# Patient Record
Sex: Female | Born: 1945
Health system: Southern US, Community
[De-identification: ages and names within clinical notes are randomized; demographics above are authoritative.]

## PROBLEM LIST (undated history)

## (undated) DIAGNOSIS — I10 Essential (primary) hypertension: Secondary | ICD-10-CM

## (undated) DIAGNOSIS — I499 Cardiac arrhythmia, unspecified: Secondary | ICD-10-CM

## (undated) DIAGNOSIS — M1990 Unspecified osteoarthritis, unspecified site: Secondary | ICD-10-CM

## (undated) DIAGNOSIS — Z974 Presence of external hearing-aid: Secondary | ICD-10-CM

## (undated) DIAGNOSIS — N950 Postmenopausal bleeding: Secondary | ICD-10-CM

## (undated) DIAGNOSIS — F419 Anxiety disorder, unspecified: Secondary | ICD-10-CM

## (undated) DIAGNOSIS — R7303 Prediabetes: Secondary | ICD-10-CM

## (undated) HISTORY — PX: EYE SURGERY: SHX253

## (undated) HISTORY — PX: OTHER SURGICAL HISTORY: SHX169

---

## 1997-06-08 ENCOUNTER — Other Ambulatory Visit: Admission: RE | Admit: 1997-06-08 | Discharge: 1997-06-08 | Payer: Self-pay | Admitting: Obstetrics & Gynecology

## 1998-05-21 ENCOUNTER — Other Ambulatory Visit: Admission: RE | Admit: 1998-05-21 | Discharge: 1998-05-21 | Payer: Self-pay | Admitting: Obstetrics & Gynecology

## 1999-08-19 ENCOUNTER — Other Ambulatory Visit: Admission: RE | Admit: 1999-08-19 | Discharge: 1999-08-19 | Payer: Self-pay | Admitting: Obstetrics & Gynecology

## 2000-06-09 ENCOUNTER — Emergency Department (HOSPITAL_COMMUNITY): Admission: EM | Admit: 2000-06-09 | Discharge: 2000-06-09 | Payer: Self-pay | Admitting: Emergency Medicine

## 2001-06-07 ENCOUNTER — Emergency Department (HOSPITAL_COMMUNITY): Admission: EM | Admit: 2001-06-07 | Discharge: 2001-06-07 | Payer: Self-pay | Admitting: Emergency Medicine

## 2001-10-03 ENCOUNTER — Other Ambulatory Visit: Admission: RE | Admit: 2001-10-03 | Discharge: 2001-10-03 | Payer: Self-pay | Admitting: Obstetrics & Gynecology

## 2002-11-21 ENCOUNTER — Other Ambulatory Visit: Admission: RE | Admit: 2002-11-21 | Discharge: 2002-11-21 | Payer: Self-pay | Admitting: Obstetrics & Gynecology

## 2009-01-04 ENCOUNTER — Ambulatory Visit (HOSPITAL_COMMUNITY): Admission: RE | Admit: 2009-01-04 | Discharge: 2009-01-04 | Payer: Self-pay | Admitting: Obstetrics & Gynecology

## 2010-02-04 ENCOUNTER — Encounter
Admission: RE | Admit: 2010-02-04 | Discharge: 2010-02-04 | Payer: Self-pay | Source: Home / Self Care | Attending: Obstetrics & Gynecology | Admitting: Obstetrics & Gynecology

## 2011-01-13 DIAGNOSIS — H6120 Impacted cerumen, unspecified ear: Secondary | ICD-10-CM | POA: Diagnosis not present

## 2011-02-15 DIAGNOSIS — E782 Mixed hyperlipidemia: Secondary | ICD-10-CM | POA: Diagnosis not present

## 2011-02-15 DIAGNOSIS — I10 Essential (primary) hypertension: Secondary | ICD-10-CM | POA: Diagnosis not present

## 2011-02-22 DIAGNOSIS — R7301 Impaired fasting glucose: Secondary | ICD-10-CM | POA: Diagnosis not present

## 2011-02-22 DIAGNOSIS — E782 Mixed hyperlipidemia: Secondary | ICD-10-CM | POA: Diagnosis not present

## 2011-02-22 DIAGNOSIS — I10 Essential (primary) hypertension: Secondary | ICD-10-CM | POA: Diagnosis not present

## 2011-05-03 DIAGNOSIS — L84 Corns and callosities: Secondary | ICD-10-CM | POA: Diagnosis not present

## 2011-05-03 DIAGNOSIS — M2040 Other hammer toe(s) (acquired), unspecified foot: Secondary | ICD-10-CM | POA: Diagnosis not present

## 2011-05-03 DIAGNOSIS — Q828 Other specified congenital malformations of skin: Secondary | ICD-10-CM | POA: Diagnosis not present

## 2011-05-11 DIAGNOSIS — M79609 Pain in unspecified limb: Secondary | ICD-10-CM | POA: Diagnosis not present

## 2011-06-22 DIAGNOSIS — E782 Mixed hyperlipidemia: Secondary | ICD-10-CM | POA: Diagnosis not present

## 2011-06-22 DIAGNOSIS — I10 Essential (primary) hypertension: Secondary | ICD-10-CM | POA: Diagnosis not present

## 2011-06-22 DIAGNOSIS — R7301 Impaired fasting glucose: Secondary | ICD-10-CM | POA: Diagnosis not present

## 2011-06-29 ENCOUNTER — Other Ambulatory Visit: Payer: Self-pay | Admitting: Internal Medicine

## 2011-06-29 DIAGNOSIS — Z1231 Encounter for screening mammogram for malignant neoplasm of breast: Secondary | ICD-10-CM

## 2011-06-29 DIAGNOSIS — I10 Essential (primary) hypertension: Secondary | ICD-10-CM | POA: Diagnosis not present

## 2011-06-29 DIAGNOSIS — Z0000 Encounter for general adult medical examination without abnormal findings: Secondary | ICD-10-CM | POA: Diagnosis not present

## 2011-06-29 DIAGNOSIS — E782 Mixed hyperlipidemia: Secondary | ICD-10-CM | POA: Diagnosis not present

## 2011-06-29 DIAGNOSIS — Z1331 Encounter for screening for depression: Secondary | ICD-10-CM | POA: Diagnosis not present

## 2011-07-05 ENCOUNTER — Ambulatory Visit
Admission: RE | Admit: 2011-07-05 | Discharge: 2011-07-05 | Disposition: A | Discharge status: Home / Self Care | Payer: Medicare Other | Source: Ambulatory Visit | Attending: Internal Medicine | Admitting: Internal Medicine

## 2011-07-05 DIAGNOSIS — Z1231 Encounter for screening mammogram for malignant neoplasm of breast: Secondary | ICD-10-CM | POA: Diagnosis not present

## 2011-11-01 DIAGNOSIS — J029 Acute pharyngitis, unspecified: Secondary | ICD-10-CM | POA: Diagnosis not present

## 2011-11-01 DIAGNOSIS — J069 Acute upper respiratory infection, unspecified: Secondary | ICD-10-CM | POA: Diagnosis not present

## 2011-11-09 DIAGNOSIS — E782 Mixed hyperlipidemia: Secondary | ICD-10-CM | POA: Diagnosis not present

## 2011-11-09 DIAGNOSIS — I10 Essential (primary) hypertension: Secondary | ICD-10-CM | POA: Diagnosis not present

## 2011-11-13 DIAGNOSIS — J209 Acute bronchitis, unspecified: Secondary | ICD-10-CM | POA: Diagnosis not present

## 2011-11-13 DIAGNOSIS — E782 Mixed hyperlipidemia: Secondary | ICD-10-CM | POA: Diagnosis not present

## 2011-11-13 DIAGNOSIS — R7301 Impaired fasting glucose: Secondary | ICD-10-CM | POA: Diagnosis not present

## 2011-11-13 DIAGNOSIS — I10 Essential (primary) hypertension: Secondary | ICD-10-CM | POA: Diagnosis not present

## 2011-12-29 DIAGNOSIS — Z23 Encounter for immunization: Secondary | ICD-10-CM | POA: Diagnosis not present

## 2012-01-08 DIAGNOSIS — H25049 Posterior subcapsular polar age-related cataract, unspecified eye: Secondary | ICD-10-CM | POA: Diagnosis not present

## 2012-01-08 DIAGNOSIS — H2510 Age-related nuclear cataract, unspecified eye: Secondary | ICD-10-CM | POA: Diagnosis not present

## 2012-01-11 DIAGNOSIS — H35349 Macular cyst, hole, or pseudohole, unspecified eye: Secondary | ICD-10-CM | POA: Diagnosis not present

## 2012-01-16 DIAGNOSIS — H35349 Macular cyst, hole, or pseudohole, unspecified eye: Secondary | ICD-10-CM | POA: Diagnosis not present

## 2012-01-23 ENCOUNTER — Ambulatory Visit: Payer: Self-pay | Admitting: Ophthalmology

## 2012-01-23 DIAGNOSIS — I10 Essential (primary) hypertension: Secondary | ICD-10-CM | POA: Diagnosis not present

## 2012-01-23 DIAGNOSIS — H35349 Macular cyst, hole, or pseudohole, unspecified eye: Secondary | ICD-10-CM | POA: Diagnosis not present

## 2012-01-23 DIAGNOSIS — Z01810 Encounter for preprocedural cardiovascular examination: Secondary | ICD-10-CM | POA: Diagnosis not present

## 2012-01-31 ENCOUNTER — Ambulatory Visit: Payer: Self-pay | Admitting: Ophthalmology

## 2012-01-31 DIAGNOSIS — M129 Arthropathy, unspecified: Secondary | ICD-10-CM | POA: Diagnosis not present

## 2012-01-31 DIAGNOSIS — Z79899 Other long term (current) drug therapy: Secondary | ICD-10-CM | POA: Diagnosis not present

## 2012-01-31 DIAGNOSIS — I10 Essential (primary) hypertension: Secondary | ICD-10-CM | POA: Diagnosis not present

## 2012-01-31 DIAGNOSIS — H9190 Unspecified hearing loss, unspecified ear: Secondary | ICD-10-CM | POA: Diagnosis not present

## 2012-01-31 DIAGNOSIS — H35349 Macular cyst, hole, or pseudohole, unspecified eye: Secondary | ICD-10-CM | POA: Diagnosis not present

## 2012-02-29 DIAGNOSIS — H35349 Macular cyst, hole, or pseudohole, unspecified eye: Secondary | ICD-10-CM | POA: Diagnosis not present

## 2012-03-26 DIAGNOSIS — H35349 Macular cyst, hole, or pseudohole, unspecified eye: Secondary | ICD-10-CM | POA: Diagnosis not present

## 2012-04-10 DIAGNOSIS — H52209 Unspecified astigmatism, unspecified eye: Secondary | ICD-10-CM | POA: Diagnosis not present

## 2012-04-10 DIAGNOSIS — H2510 Age-related nuclear cataract, unspecified eye: Secondary | ICD-10-CM | POA: Diagnosis not present

## 2012-04-10 DIAGNOSIS — H40059 Ocular hypertension, unspecified eye: Secondary | ICD-10-CM | POA: Diagnosis not present

## 2012-04-10 DIAGNOSIS — H25049 Posterior subcapsular polar age-related cataract, unspecified eye: Secondary | ICD-10-CM | POA: Diagnosis not present

## 2012-04-15 DIAGNOSIS — I10 Essential (primary) hypertension: Secondary | ICD-10-CM | POA: Diagnosis not present

## 2012-04-22 DIAGNOSIS — I10 Essential (primary) hypertension: Secondary | ICD-10-CM | POA: Diagnosis not present

## 2012-04-22 DIAGNOSIS — E782 Mixed hyperlipidemia: Secondary | ICD-10-CM | POA: Diagnosis not present

## 2012-04-22 DIAGNOSIS — R7301 Impaired fasting glucose: Secondary | ICD-10-CM | POA: Diagnosis not present

## 2012-04-30 DIAGNOSIS — H25049 Posterior subcapsular polar age-related cataract, unspecified eye: Secondary | ICD-10-CM | POA: Diagnosis not present

## 2012-04-30 DIAGNOSIS — H2510 Age-related nuclear cataract, unspecified eye: Secondary | ICD-10-CM | POA: Diagnosis not present

## 2012-04-30 DIAGNOSIS — H2589 Other age-related cataract: Secondary | ICD-10-CM | POA: Diagnosis not present

## 2012-04-30 DIAGNOSIS — H52209 Unspecified astigmatism, unspecified eye: Secondary | ICD-10-CM | POA: Diagnosis not present

## 2012-05-21 DIAGNOSIS — H35349 Macular cyst, hole, or pseudohole, unspecified eye: Secondary | ICD-10-CM | POA: Diagnosis not present

## 2012-07-15 DIAGNOSIS — H40059 Ocular hypertension, unspecified eye: Secondary | ICD-10-CM | POA: Diagnosis not present

## 2012-07-31 ENCOUNTER — Other Ambulatory Visit: Payer: Self-pay | Admitting: Internal Medicine

## 2012-07-31 DIAGNOSIS — M546 Pain in thoracic spine: Secondary | ICD-10-CM | POA: Diagnosis not present

## 2012-07-31 DIAGNOSIS — M5410 Radiculopathy, site unspecified: Secondary | ICD-10-CM

## 2012-07-31 DIAGNOSIS — IMO0002 Reserved for concepts with insufficient information to code with codable children: Secondary | ICD-10-CM | POA: Diagnosis not present

## 2012-08-08 ENCOUNTER — Other Ambulatory Visit: Payer: Self-pay

## 2012-08-08 ENCOUNTER — Other Ambulatory Visit: Payer: Medicare Other

## 2012-08-08 DIAGNOSIS — Z1231 Encounter for screening mammogram for malignant neoplasm of breast: Secondary | ICD-10-CM

## 2012-08-14 DIAGNOSIS — Z961 Presence of intraocular lens: Secondary | ICD-10-CM | POA: Diagnosis not present

## 2012-08-14 DIAGNOSIS — H40059 Ocular hypertension, unspecified eye: Secondary | ICD-10-CM | POA: Diagnosis not present

## 2012-08-21 DIAGNOSIS — E782 Mixed hyperlipidemia: Secondary | ICD-10-CM | POA: Diagnosis not present

## 2012-08-21 DIAGNOSIS — I10 Essential (primary) hypertension: Secondary | ICD-10-CM | POA: Diagnosis not present

## 2012-08-22 ENCOUNTER — Ambulatory Visit
Admission: RE | Admit: 2012-08-22 | Discharge: 2012-08-22 | Disposition: A | Discharge status: Home / Self Care | Payer: Medicare Other | Source: Ambulatory Visit

## 2012-08-22 DIAGNOSIS — Z1231 Encounter for screening mammogram for malignant neoplasm of breast: Secondary | ICD-10-CM | POA: Diagnosis not present

## 2012-08-28 DIAGNOSIS — R7301 Impaired fasting glucose: Secondary | ICD-10-CM | POA: Diagnosis not present

## 2012-08-28 DIAGNOSIS — E782 Mixed hyperlipidemia: Secondary | ICD-10-CM | POA: Diagnosis not present

## 2012-08-28 DIAGNOSIS — N644 Mastodynia: Secondary | ICD-10-CM | POA: Diagnosis not present

## 2012-08-28 DIAGNOSIS — I10 Essential (primary) hypertension: Secondary | ICD-10-CM | POA: Diagnosis not present

## 2012-09-19 DIAGNOSIS — H40009 Preglaucoma, unspecified, unspecified eye: Secondary | ICD-10-CM | POA: Diagnosis not present

## 2012-09-19 DIAGNOSIS — H35349 Macular cyst, hole, or pseudohole, unspecified eye: Secondary | ICD-10-CM | POA: Diagnosis not present

## 2012-10-01 DIAGNOSIS — Z23 Encounter for immunization: Secondary | ICD-10-CM | POA: Diagnosis not present

## 2012-12-03 DIAGNOSIS — I10 Essential (primary) hypertension: Secondary | ICD-10-CM | POA: Diagnosis not present

## 2012-12-12 DIAGNOSIS — H2510 Age-related nuclear cataract, unspecified eye: Secondary | ICD-10-CM | POA: Diagnosis not present

## 2012-12-12 DIAGNOSIS — E782 Mixed hyperlipidemia: Secondary | ICD-10-CM | POA: Diagnosis not present

## 2012-12-12 DIAGNOSIS — H4011X0 Primary open-angle glaucoma, stage unspecified: Secondary | ICD-10-CM | POA: Diagnosis not present

## 2012-12-12 DIAGNOSIS — I10 Essential (primary) hypertension: Secondary | ICD-10-CM | POA: Diagnosis not present

## 2012-12-12 DIAGNOSIS — R7301 Impaired fasting glucose: Secondary | ICD-10-CM | POA: Diagnosis not present

## 2012-12-12 DIAGNOSIS — H409 Unspecified glaucoma: Secondary | ICD-10-CM | POA: Diagnosis not present

## 2013-02-04 DIAGNOSIS — H52209 Unspecified astigmatism, unspecified eye: Secondary | ICD-10-CM | POA: Diagnosis not present

## 2013-02-04 DIAGNOSIS — H2589 Other age-related cataract: Secondary | ICD-10-CM | POA: Diagnosis not present

## 2013-02-04 DIAGNOSIS — H25049 Posterior subcapsular polar age-related cataract, unspecified eye: Secondary | ICD-10-CM | POA: Diagnosis not present

## 2013-02-04 DIAGNOSIS — H2510 Age-related nuclear cataract, unspecified eye: Secondary | ICD-10-CM | POA: Diagnosis not present

## 2013-02-04 DIAGNOSIS — H25019 Cortical age-related cataract, unspecified eye: Secondary | ICD-10-CM | POA: Diagnosis not present

## 2013-04-03 DIAGNOSIS — H4010X0 Unspecified open-angle glaucoma, stage unspecified: Secondary | ICD-10-CM | POA: Diagnosis not present

## 2013-04-03 DIAGNOSIS — H3581 Retinal edema: Secondary | ICD-10-CM | POA: Diagnosis not present

## 2013-04-03 DIAGNOSIS — H409 Unspecified glaucoma: Secondary | ICD-10-CM | POA: Diagnosis not present

## 2013-04-04 DIAGNOSIS — H409 Unspecified glaucoma: Secondary | ICD-10-CM | POA: Diagnosis not present

## 2013-04-04 DIAGNOSIS — H4010X0 Unspecified open-angle glaucoma, stage unspecified: Secondary | ICD-10-CM | POA: Diagnosis not present

## 2013-04-10 DIAGNOSIS — I10 Essential (primary) hypertension: Secondary | ICD-10-CM | POA: Diagnosis not present

## 2013-04-15 DIAGNOSIS — I10 Essential (primary) hypertension: Secondary | ICD-10-CM | POA: Diagnosis not present

## 2013-04-15 DIAGNOSIS — E782 Mixed hyperlipidemia: Secondary | ICD-10-CM | POA: Diagnosis not present

## 2013-06-11 DIAGNOSIS — H409 Unspecified glaucoma: Secondary | ICD-10-CM | POA: Diagnosis not present

## 2013-06-11 DIAGNOSIS — H4010X0 Unspecified open-angle glaucoma, stage unspecified: Secondary | ICD-10-CM | POA: Diagnosis not present

## 2013-06-26 DIAGNOSIS — F3289 Other specified depressive episodes: Secondary | ICD-10-CM | POA: Diagnosis not present

## 2013-06-26 DIAGNOSIS — F329 Major depressive disorder, single episode, unspecified: Secondary | ICD-10-CM | POA: Diagnosis not present

## 2013-07-03 DIAGNOSIS — H4010X0 Unspecified open-angle glaucoma, stage unspecified: Secondary | ICD-10-CM | POA: Diagnosis not present

## 2013-07-03 DIAGNOSIS — H35349 Macular cyst, hole, or pseudohole, unspecified eye: Secondary | ICD-10-CM | POA: Diagnosis not present

## 2013-07-07 DIAGNOSIS — F3289 Other specified depressive episodes: Secondary | ICD-10-CM | POA: Diagnosis not present

## 2013-07-07 DIAGNOSIS — F329 Major depressive disorder, single episode, unspecified: Secondary | ICD-10-CM | POA: Diagnosis not present

## 2013-07-31 DIAGNOSIS — F3289 Other specified depressive episodes: Secondary | ICD-10-CM | POA: Diagnosis not present

## 2013-07-31 DIAGNOSIS — F329 Major depressive disorder, single episode, unspecified: Secondary | ICD-10-CM | POA: Diagnosis not present

## 2013-08-13 DIAGNOSIS — F329 Major depressive disorder, single episode, unspecified: Secondary | ICD-10-CM | POA: Diagnosis not present

## 2013-08-13 DIAGNOSIS — F3289 Other specified depressive episodes: Secondary | ICD-10-CM | POA: Diagnosis not present

## 2013-08-25 DIAGNOSIS — I10 Essential (primary) hypertension: Secondary | ICD-10-CM | POA: Diagnosis not present

## 2013-08-27 DIAGNOSIS — F3289 Other specified depressive episodes: Secondary | ICD-10-CM | POA: Diagnosis not present

## 2013-08-27 DIAGNOSIS — F329 Major depressive disorder, single episode, unspecified: Secondary | ICD-10-CM | POA: Diagnosis not present

## 2013-09-01 DIAGNOSIS — Z1331 Encounter for screening for depression: Secondary | ICD-10-CM | POA: Diagnosis not present

## 2013-09-01 DIAGNOSIS — Z23 Encounter for immunization: Secondary | ICD-10-CM | POA: Diagnosis not present

## 2013-09-01 DIAGNOSIS — R7301 Impaired fasting glucose: Secondary | ICD-10-CM | POA: Diagnosis not present

## 2013-09-01 DIAGNOSIS — I10 Essential (primary) hypertension: Secondary | ICD-10-CM | POA: Diagnosis not present

## 2013-09-01 DIAGNOSIS — E782 Mixed hyperlipidemia: Secondary | ICD-10-CM | POA: Diagnosis not present

## 2013-09-09 DIAGNOSIS — Z124 Encounter for screening for malignant neoplasm of cervix: Secondary | ICD-10-CM | POA: Diagnosis not present

## 2013-09-09 DIAGNOSIS — Z01419 Encounter for gynecological examination (general) (routine) without abnormal findings: Secondary | ICD-10-CM | POA: Diagnosis not present

## 2013-09-09 DIAGNOSIS — Z1151 Encounter for screening for human papillomavirus (HPV): Secondary | ICD-10-CM | POA: Diagnosis not present

## 2013-09-23 DIAGNOSIS — F329 Major depressive disorder, single episode, unspecified: Secondary | ICD-10-CM | POA: Diagnosis not present

## 2013-09-23 DIAGNOSIS — F3289 Other specified depressive episodes: Secondary | ICD-10-CM | POA: Diagnosis not present

## 2013-10-08 DIAGNOSIS — Z1231 Encounter for screening mammogram for malignant neoplasm of breast: Secondary | ICD-10-CM | POA: Diagnosis not present

## 2013-10-22 DIAGNOSIS — F411 Generalized anxiety disorder: Secondary | ICD-10-CM | POA: Diagnosis not present

## 2013-10-24 DIAGNOSIS — H4011X1 Primary open-angle glaucoma, mild stage: Secondary | ICD-10-CM | POA: Diagnosis not present

## 2013-11-27 DIAGNOSIS — F411 Generalized anxiety disorder: Secondary | ICD-10-CM | POA: Diagnosis not present

## 2013-12-15 DIAGNOSIS — I10 Essential (primary) hypertension: Secondary | ICD-10-CM | POA: Diagnosis not present

## 2013-12-15 DIAGNOSIS — E782 Mixed hyperlipidemia: Secondary | ICD-10-CM | POA: Diagnosis not present

## 2013-12-19 DIAGNOSIS — E782 Mixed hyperlipidemia: Secondary | ICD-10-CM | POA: Diagnosis not present

## 2013-12-19 DIAGNOSIS — Z1211 Encounter for screening for malignant neoplasm of colon: Secondary | ICD-10-CM | POA: Diagnosis not present

## 2013-12-19 DIAGNOSIS — I10 Essential (primary) hypertension: Secondary | ICD-10-CM | POA: Diagnosis not present

## 2013-12-19 DIAGNOSIS — R7301 Impaired fasting glucose: Secondary | ICD-10-CM | POA: Diagnosis not present

## 2013-12-19 DIAGNOSIS — F329 Major depressive disorder, single episode, unspecified: Secondary | ICD-10-CM | POA: Diagnosis not present

## 2013-12-25 DIAGNOSIS — F411 Generalized anxiety disorder: Secondary | ICD-10-CM | POA: Diagnosis not present

## 2014-01-07 DIAGNOSIS — Z23 Encounter for immunization: Secondary | ICD-10-CM | POA: Diagnosis not present

## 2014-01-27 ENCOUNTER — Other Ambulatory Visit: Payer: Self-pay | Admitting: Gastroenterology

## 2014-01-29 DIAGNOSIS — F411 Generalized anxiety disorder: Secondary | ICD-10-CM | POA: Diagnosis not present

## 2014-02-19 DIAGNOSIS — H4011X1 Primary open-angle glaucoma, mild stage: Secondary | ICD-10-CM | POA: Diagnosis not present

## 2014-03-05 DIAGNOSIS — F411 Generalized anxiety disorder: Secondary | ICD-10-CM | POA: Diagnosis not present

## 2014-03-27 ENCOUNTER — Encounter (HOSPITAL_COMMUNITY): Payer: Self-pay | Admitting: *Deleted

## 2014-04-02 DIAGNOSIS — F411 Generalized anxiety disorder: Secondary | ICD-10-CM | POA: Diagnosis not present

## 2014-04-07 ENCOUNTER — Ambulatory Visit (HOSPITAL_COMMUNITY): Payer: Medicare Other | Admitting: Registered Nurse

## 2014-04-07 ENCOUNTER — Encounter (HOSPITAL_COMMUNITY)
Admission: RE | Disposition: A | Discharge status: Home / Self Care | Payer: Self-pay | Source: Ambulatory Visit | Attending: Gastroenterology

## 2014-04-07 ENCOUNTER — Encounter (HOSPITAL_COMMUNITY): Payer: Self-pay

## 2014-04-07 ENCOUNTER — Ambulatory Visit (HOSPITAL_COMMUNITY)
Admission: RE | Admit: 2014-04-07 | Discharge: 2014-04-07 | Disposition: A | Discharge status: Home / Self Care | Payer: Medicare Other | Source: Ambulatory Visit | Attending: Gastroenterology | Admitting: Gastroenterology

## 2014-04-07 DIAGNOSIS — K219 Gastro-esophageal reflux disease without esophagitis: Secondary | ICD-10-CM | POA: Insufficient documentation

## 2014-04-07 DIAGNOSIS — D125 Benign neoplasm of sigmoid colon: Secondary | ICD-10-CM | POA: Insufficient documentation

## 2014-04-07 DIAGNOSIS — Z87891 Personal history of nicotine dependence: Secondary | ICD-10-CM | POA: Insufficient documentation

## 2014-04-07 DIAGNOSIS — M1990 Unspecified osteoarthritis, unspecified site: Secondary | ICD-10-CM | POA: Diagnosis not present

## 2014-04-07 DIAGNOSIS — H409 Unspecified glaucoma: Secondary | ICD-10-CM | POA: Diagnosis not present

## 2014-04-07 DIAGNOSIS — I10 Essential (primary) hypertension: Secondary | ICD-10-CM | POA: Diagnosis not present

## 2014-04-07 DIAGNOSIS — K5730 Diverticulosis of large intestine without perforation or abscess without bleeding: Secondary | ICD-10-CM | POA: Diagnosis not present

## 2014-04-07 DIAGNOSIS — Z86010 Personal history of colonic polyps: Secondary | ICD-10-CM | POA: Insufficient documentation

## 2014-04-07 DIAGNOSIS — Z1211 Encounter for screening for malignant neoplasm of colon: Secondary | ICD-10-CM | POA: Diagnosis not present

## 2014-04-07 DIAGNOSIS — K635 Polyp of colon: Secondary | ICD-10-CM | POA: Diagnosis not present

## 2014-04-07 DIAGNOSIS — Z09 Encounter for follow-up examination after completed treatment for conditions other than malignant neoplasm: Secondary | ICD-10-CM | POA: Diagnosis present

## 2014-04-07 HISTORY — PX: COLONOSCOPY WITH PROPOFOL: SHX5780

## 2014-04-07 HISTORY — DX: Essential (primary) hypertension: I10

## 2014-04-07 HISTORY — DX: Unspecified osteoarthritis, unspecified site: M19.90

## 2014-04-07 HISTORY — DX: Cardiac arrhythmia, unspecified: I49.9

## 2014-04-07 SURGERY — COLONOSCOPY WITH PROPOFOL
Anesthesia: Monitor Anesthesia Care

## 2014-04-07 MED ORDER — LIDOCAINE HCL (CARDIAC) 20 MG/ML IV SOLN
INTRAVENOUS | Status: AC
  Filled 2014-04-07: qty 5

## 2014-04-07 MED ORDER — PROPOFOL 10 MG/ML IV BOLUS
INTRAVENOUS | Status: DC | PRN
  Administered 2014-04-07 (×2): 40 mg via INTRAVENOUS

## 2014-04-07 MED ORDER — PROPOFOL INFUSION 10 MG/ML OPTIME
INTRAVENOUS | Status: DC | PRN
  Administered 2014-04-07: 140 ug/kg/min via INTRAVENOUS

## 2014-04-07 MED ORDER — SODIUM CHLORIDE 0.9 % IV SOLN: INTRAVENOUS | Status: DC

## 2014-04-07 MED ORDER — LACTATED RINGERS IV SOLN
INTRAVENOUS | Status: DC
  Administered 2014-04-07: 1000 mL via INTRAVENOUS

## 2014-04-07 MED ORDER — PROPOFOL 10 MG/ML IV BOLUS
INTRAVENOUS | Status: AC
  Filled 2014-04-07: qty 20

## 2014-04-07 MED ORDER — LIDOCAINE HCL (CARDIAC) 20 MG/ML IV SOLN
INTRAVENOUS | Status: DC | PRN
  Administered 2014-04-07: 100 mg via INTRAVENOUS

## 2014-04-07 SURGICAL SUPPLY — 21 items

## 2014-04-07 NOTE — H&P (Signed)
  Procedure: Surveillance colonoscopy. 04/05/2009 colonoscopy performed with removal of a 3 mm adenomatous rectal polyp.  History: The patient is a 69 year old female born 28-Mar-1945. She is scheduled to undergo a surveillance colonoscopy today.  Past medical history: Hypertension. Hypercholesterolemia. Gastroesophageal reflux. Urticaria. Cataract. Glaucoma.  Medication allergies: None  Exam: The patient is alert and lying comfortably on the endoscopy stretcher. Abdomen is soft and nontender to palpation. Lungs are clear to auscultation. Cardiac exam reveals a regular rhythm.  Plan: Proceed with surveillance colonoscopy

## 2014-04-07 NOTE — Anesthesia Preprocedure Evaluation (Addendum)
Anesthesia Evaluation  Patient identified by MRN, date of birth, ID band Patient awake    Reviewed: Allergy & Precautions, NPO status , Patient's Chart, lab work & pertinent test results  Airway Mallampati: II  TM Distance: >3 FB Neck ROM: Full    Dental  (+) Teeth Intact   Pulmonary former smoker,  breath sounds clear to auscultation        Cardiovascular hypertension, Rhythm:Regular Rate:Normal     Neuro/Psych negative neurological ROS  negative psych ROS   GI/Hepatic negative GI ROS, Neg liver ROS,   Endo/Other  negative endocrine ROS  Renal/GU negative Renal ROS     Musculoskeletal  (+) Arthritis -,   Abdominal   Peds  Hematology negative hematology ROS (+)   Anesthesia Other Findings   Reproductive/Obstetrics                           Anesthesia Physical Anesthesia Plan  ASA: II  Anesthesia Plan: MAC   Post-op Pain Management:    Induction: Intravenous  Airway Management Planned: Simple Face Mask and Natural Airway  Additional Equipment:   Intra-op Plan:   Post-operative Plan:   Informed Consent: I have reviewed the patients History and Physical, chart, labs and discussed the procedure including the risks, benefits and alternatives for the proposed anesthesia with the patient or authorized representative who has indicated his/her understanding and acceptance.     Plan Discussed with: CRNA  Anesthesia Plan Comments:         Anesthesia Quick Evaluation

## 2014-04-07 NOTE — Discharge Instructions (Signed)
Colonoscopy, Care After °These instructions give you information on caring for yourself after your procedure. Your doctor may also give you more specific instructions. Call your doctor if you have any problems or questions after your procedure. °HOME CARE °· Do not drive for 24 hours. °· Do not sign important papers or use machinery for 24 hours. °· You may shower. °· You may go back to your usual activities, but go slower for the first 24 hours. °· Take rest breaks often during the first 24 hours. °· Walk around or use warm packs on your belly (abdomen) if you have belly cramping or gas. °· Drink enough fluids to keep your pee (urine) clear or pale yellow. °· Resume your normal diet. Avoid heavy or fried foods. °· Avoid drinking alcohol for 24 hours or as told by your doctor. °· Only take medicines as told by your doctor. °If a tissue sample (biopsy) was taken during the procedure:  °· Do not take aspirin or blood thinners for 7 days, or as told by your doctor. °· Do not drink alcohol for 7 days, or as told by your doctor. °· Eat soft foods for the first 24 hours. °GET HELP IF: °You still have a small amount of blood in your poop (stool) 2-3 days after the procedure. °GET HELP RIGHT AWAY IF: °· You have more than a small amount of blood in your poop. °· You see clumps of tissue (blood clots) in your poop. °· Your belly is puffy (swollen). °· You feel sick to your stomach (nauseous) or throw up (vomit). °· You have a fever. °· You have belly pain that gets worse and medicine does not help. °MAKE SURE YOU: °· Understand these instructions. °· Will watch your condition. °· Will get help right away if you are not doing well or get worse. °Document Released: 01/28/2010 Document Revised: 12/31/2012 Document Reviewed: 09/02/2012 °ExitCare® Patient Information ©2015 [employer]. This information is not intended to replace advice given to you by your health care provider. Make sure you discuss any questions you have with  your health care provider. ° °

## 2014-04-07 NOTE — Anesthesia Procedure Notes (Signed)
Procedure Name: MAC Date/Time: 04/07/2014 7:46 AM Performed by: Carleene Cooper A Pre-anesthesia Checklist: Patient identified, Timeout performed, Emergency Drugs available, Suction available and Patient being monitored Patient Re-evaluated:Patient Re-evaluated prior to inductionOxygen Delivery Method: Simple face mask Dental Injury: Teeth and Oropharynx as per pre-operative assessment

## 2014-04-07 NOTE — Transfer of Care (Signed)
Immediate Anesthesia Transfer of Care Note  Patient: Marisa Holden  Procedure(s) Performed: Procedure(s): COLONOSCOPY WITH PROPOFOL (N/A)  Patient Location: PACU and Endoscopy Unit  Anesthesia Type:MAC  Level of Consciousness: awake, alert , oriented and patient cooperative  Airway & Oxygen Therapy: Patient Spontanous Breathing and Patient connected to face mask oxygen  Post-op Assessment: Report given to RN, Post -op Vital signs reviewed and stable and Patient moving all extremities  Post vital signs: Reviewed and stable  Last Vitals:  Filed Vitals:   04/07/14 0821  BP:   Pulse: 72  Temp:   Resp: 14    Complications: No apparent anesthesia complications

## 2014-04-07 NOTE — Op Note (Signed)
Procedure: Surveillance colonoscopy. 04/05/2009 colonoscopy with removal of a 3 mm adenomatous rectal polyp  Endoscopist: Earle Gell  Premedication: Propofol administered by anesthesia  Procedure: The patient was placed in the left lateral decubitus position. Anal inspection and digital rectal exam were normal. The Pentax pediatric colonoscope was introduced into the rectum and advanced to the cecum. A normal-appearing appendiceal orifice was identified. A normal-appearing ileocecal valve was identified. Colonic preparation for the exam today was good. Withdrawal time was 11 minutes  Rectum. Normal. Retroflexed view of the distal rectum normal  Sigmoid colon. Colonic diverticulosis. From the distal sigmoid colon, a 5 mm sessile polyp was removed with the cold snare  Descending colon. Colonic diverticulosis  Splenic flexure. Normal  Transverse colon. Normal  Hepatic flexure. Normal  Ascending colon. Normal  Cecum and ileocecal valve. Normal  Assessment:  #1. From the distal sigmoid colon, a 5 mm sessile polyp was removed with the cold snare  #2. Left colonic diverticulosis.

## 2014-04-07 NOTE — Anesthesia Postprocedure Evaluation (Signed)
  Anesthesia Post-op Note  Patient: Marisa Holden  Procedure(s) Performed: Procedure(s): COLONOSCOPY WITH PROPOFOL (N/A)  Patient Location: PACU  Anesthesia Type:MAC  Level of Consciousness: awake and alert   Airway and Oxygen Therapy: Patient Spontanous Breathing  Post-op Pain: none  Post-op Assessment: Post-op Vital signs reviewed  Post-op Vital Signs: Reviewed  Last Vitals:  Filed Vitals:   04/07/14 0840  BP: 145/84  Pulse: 75  Temp:   Resp: 23    Complications: No apparent anesthesia complications

## 2014-04-08 ENCOUNTER — Encounter (HOSPITAL_COMMUNITY): Payer: Self-pay | Admitting: Gastroenterology

## 2014-05-01 NOTE — Op Note (Signed)
PATIENT NAME:  Marisa Holden, REASONS MR#:  [id number] DATE OF BIRTH:  10/31/1942  DATE OF PROCEDURE:  01/31/2012  PROCEDURES PERFORMED:  1.  Pars plana vitrectomy of the left eye.  2.  Internal limiting membrane peel of the left eye.  3.  Gas exchange of the left eye.   ESTIMATED BLOOD LOSS: Less than 1 mL.  PRIMARY SURGEON: Garlan Fair, M.D.   ANESTHESIA: Retrobulbar block of the left eye with monitored anesthesia care.   COMPLICATIONS: None.   ESTIMATED BLOOD LOSS: Less than 1 mL.   INDICATIONS FOR PROCEDURE: This is a patient, who presented to my office with sudden loss of vision in the left eye. 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 The risks, benefits, and alternatives of the above procedure were discussed and the patient wished to proceed.   DETAILS OF PROCEDURE: After informed consent was obtained, the patient was brought into the operative suite at [facility]. The patient was placed in supine position and was given a small dose of Alfenta and a retrobulbar block was performed of the left eye by the primary surgeon without any complications. The left eye was prepped and draped in sterile manner. After a lid speculum was inserted, a 25-gauge trocar was placed inferotemporally through displaced conjunctiva in an oblique fashion 4 mm beyond the limbus. The infusion cannula was turned on and inserted through the trocar and secured in position with Steri-Strips. Two more trocars were placed in a similar fashion superotemporally and superonasally. The vitreous cutter and light pipe were introduced in the eye and a core vitrectomy was performed. Vitreous face was confirmed by visualization to already be elevated. The vitreous face was removed and the peripheral vitreous was trimmed for 360 degrees with special care taken not to hit the crystalline lens. Indocyanine green was injected onto the posterior pole and removed within 30  seconds.   An internal limiting membrane peel was performed for 360 degrees around the fovea. The macular hole visibly relaxed. A total diameter of the peel was approximately 2 disk diameters. Scleral depressed exam was performed for 360 degrees. No signs of any breaks or tears could be identified for 360 degrees. An air-fluid exchange was performed. Four minutes was allowed to elapse for dehydration of the vitreous base. This remnant fluid was removed. SF6 20% was used as an air gas exchange and the trocars were removed. The wounds were noted to be airtight. Pressure in the eye was confirmed to be approximately 15 mmHg. Dexamethasone 5 mg was given into the inferior fornix and the lid speculum was removed. The eye was cleaned and TobraDex was placed in the eye. A patch and shield were placed over the eye and the patient was taken to postanesthesia care with instructions to remain face down.    ____________________________ Teresa Pelton. Starling Manns, MD mfa:aw D: 01/31/2012 08:12:45 ET T: 01/31/2012 08:36:14 ET JOB#: [id number]  cc: Teresa Pelton. Starling Manns, MD, <Dictator> Coralee Rud MD ELECTRONICALLY SIGNED 01/31/2012 9:59

## 2014-05-18 DIAGNOSIS — F411 Generalized anxiety disorder: Secondary | ICD-10-CM | POA: Diagnosis not present

## 2014-06-17 DIAGNOSIS — S86011A Strain of right Achilles tendon, initial encounter: Secondary | ICD-10-CM | POA: Diagnosis not present

## 2014-07-15 DIAGNOSIS — L578 Other skin changes due to chronic exposure to nonionizing radiation: Secondary | ICD-10-CM | POA: Diagnosis not present

## 2014-07-15 DIAGNOSIS — L821 Other seborrheic keratosis: Secondary | ICD-10-CM | POA: Diagnosis not present

## 2014-07-15 DIAGNOSIS — L820 Inflamed seborrheic keratosis: Secondary | ICD-10-CM | POA: Diagnosis not present

## 2014-07-15 DIAGNOSIS — D1723 Benign lipomatous neoplasm of skin and subcutaneous tissue of right leg: Secondary | ICD-10-CM | POA: Diagnosis not present

## 2014-07-15 DIAGNOSIS — D1801 Hemangioma of skin and subcutaneous tissue: Secondary | ICD-10-CM | POA: Diagnosis not present

## 2014-07-15 DIAGNOSIS — I788 Other diseases of capillaries: Secondary | ICD-10-CM | POA: Diagnosis not present

## 2014-07-15 DIAGNOSIS — L918 Other hypertrophic disorders of the skin: Secondary | ICD-10-CM | POA: Diagnosis not present

## 2014-07-15 DIAGNOSIS — D2261 Melanocytic nevi of right upper limb, including shoulder: Secondary | ICD-10-CM | POA: Diagnosis not present

## 2014-08-31 DIAGNOSIS — H26491 Other secondary cataract, right eye: Secondary | ICD-10-CM | POA: Diagnosis not present

## 2014-08-31 DIAGNOSIS — Z961 Presence of intraocular lens: Secondary | ICD-10-CM | POA: Diagnosis not present

## 2014-08-31 DIAGNOSIS — H4011X1 Primary open-angle glaucoma, mild stage: Secondary | ICD-10-CM | POA: Diagnosis not present

## 2014-09-17 DIAGNOSIS — Z124 Encounter for screening for malignant neoplasm of cervix: Secondary | ICD-10-CM | POA: Diagnosis not present

## 2014-10-08 DIAGNOSIS — R7301 Impaired fasting glucose: Secondary | ICD-10-CM | POA: Diagnosis not present

## 2014-10-08 DIAGNOSIS — I10 Essential (primary) hypertension: Secondary | ICD-10-CM | POA: Diagnosis not present

## 2014-10-08 DIAGNOSIS — M25551 Pain in right hip: Secondary | ICD-10-CM | POA: Diagnosis not present

## 2014-10-08 DIAGNOSIS — Z23 Encounter for immunization: Secondary | ICD-10-CM | POA: Diagnosis not present

## 2014-10-08 DIAGNOSIS — E782 Mixed hyperlipidemia: Secondary | ICD-10-CM | POA: Diagnosis not present

## 2014-10-16 DIAGNOSIS — M1611 Unilateral primary osteoarthritis, right hip: Secondary | ICD-10-CM | POA: Diagnosis not present

## 2014-10-16 DIAGNOSIS — M7061 Trochanteric bursitis, right hip: Secondary | ICD-10-CM | POA: Diagnosis not present

## 2014-10-16 DIAGNOSIS — M545 Low back pain: Secondary | ICD-10-CM | POA: Diagnosis not present

## 2014-10-22 DIAGNOSIS — I10 Essential (primary) hypertension: Secondary | ICD-10-CM | POA: Diagnosis not present

## 2014-11-23 DIAGNOSIS — M1611 Unilateral primary osteoarthritis, right hip: Secondary | ICD-10-CM | POA: Diagnosis not present

## 2014-11-27 DIAGNOSIS — H02839 Dermatochalasis of unspecified eye, unspecified eyelid: Secondary | ICD-10-CM | POA: Diagnosis not present

## 2014-11-27 DIAGNOSIS — H26491 Other secondary cataract, right eye: Secondary | ICD-10-CM | POA: Diagnosis not present

## 2014-11-27 DIAGNOSIS — H35342 Macular cyst, hole, or pseudohole, left eye: Secondary | ICD-10-CM | POA: Diagnosis not present

## 2014-11-27 DIAGNOSIS — Z961 Presence of intraocular lens: Secondary | ICD-10-CM | POA: Diagnosis not present

## 2014-11-30 DIAGNOSIS — M1611 Unilateral primary osteoarthritis, right hip: Secondary | ICD-10-CM | POA: Diagnosis not present

## 2015-01-22 DIAGNOSIS — S83241A Other tear of medial meniscus, current injury, right knee, initial encounter: Secondary | ICD-10-CM | POA: Diagnosis not present

## 2015-02-01 DIAGNOSIS — H903 Sensorineural hearing loss, bilateral: Secondary | ICD-10-CM | POA: Diagnosis not present

## 2015-03-03 DIAGNOSIS — H401122 Primary open-angle glaucoma, left eye, moderate stage: Secondary | ICD-10-CM | POA: Diagnosis not present

## 2015-03-03 DIAGNOSIS — H26491 Other secondary cataract, right eye: Secondary | ICD-10-CM | POA: Diagnosis not present

## 2015-03-10 DIAGNOSIS — M1611 Unilateral primary osteoarthritis, right hip: Secondary | ICD-10-CM | POA: Diagnosis not present

## 2015-03-23 DIAGNOSIS — H26491 Other secondary cataract, right eye: Secondary | ICD-10-CM | POA: Diagnosis not present

## 2015-04-12 DIAGNOSIS — R739 Hyperglycemia, unspecified: Secondary | ICD-10-CM | POA: Diagnosis not present

## 2015-04-12 DIAGNOSIS — I10 Essential (primary) hypertension: Secondary | ICD-10-CM | POA: Diagnosis not present

## 2015-04-16 DIAGNOSIS — Z1389 Encounter for screening for other disorder: Secondary | ICD-10-CM | POA: Diagnosis not present

## 2015-04-16 DIAGNOSIS — R7303 Prediabetes: Secondary | ICD-10-CM | POA: Diagnosis not present

## 2015-04-16 DIAGNOSIS — I10 Essential (primary) hypertension: Secondary | ICD-10-CM | POA: Diagnosis not present

## 2015-04-16 DIAGNOSIS — E785 Hyperlipidemia, unspecified: Secondary | ICD-10-CM | POA: Diagnosis not present

## 2015-07-06 DIAGNOSIS — S83241D Other tear of medial meniscus, current injury, right knee, subsequent encounter: Secondary | ICD-10-CM | POA: Diagnosis not present

## 2015-07-06 DIAGNOSIS — S139XXA Sprain of joints and ligaments of unspecified parts of neck, initial encounter: Secondary | ICD-10-CM | POA: Diagnosis not present

## 2015-07-12 DIAGNOSIS — M542 Cervicalgia: Secondary | ICD-10-CM | POA: Diagnosis not present

## 2015-07-12 DIAGNOSIS — M4722 Other spondylosis with radiculopathy, cervical region: Secondary | ICD-10-CM | POA: Diagnosis not present

## 2015-07-16 DIAGNOSIS — M542 Cervicalgia: Secondary | ICD-10-CM | POA: Diagnosis not present

## 2015-07-16 DIAGNOSIS — M4722 Other spondylosis with radiculopathy, cervical region: Secondary | ICD-10-CM | POA: Diagnosis not present

## 2015-07-19 DIAGNOSIS — M542 Cervicalgia: Secondary | ICD-10-CM | POA: Diagnosis not present

## 2015-07-19 DIAGNOSIS — M4722 Other spondylosis with radiculopathy, cervical region: Secondary | ICD-10-CM | POA: Diagnosis not present

## 2015-07-22 DIAGNOSIS — M4722 Other spondylosis with radiculopathy, cervical region: Secondary | ICD-10-CM | POA: Diagnosis not present

## 2015-07-22 DIAGNOSIS — M542 Cervicalgia: Secondary | ICD-10-CM | POA: Diagnosis not present

## 2015-07-27 DIAGNOSIS — M542 Cervicalgia: Secondary | ICD-10-CM | POA: Diagnosis not present

## 2015-07-27 DIAGNOSIS — M4722 Other spondylosis with radiculopathy, cervical region: Secondary | ICD-10-CM | POA: Diagnosis not present

## 2015-07-29 DIAGNOSIS — M542 Cervicalgia: Secondary | ICD-10-CM | POA: Diagnosis not present

## 2015-07-29 DIAGNOSIS — M4722 Other spondylosis with radiculopathy, cervical region: Secondary | ICD-10-CM | POA: Diagnosis not present

## 2015-08-03 DIAGNOSIS — M4722 Other spondylosis with radiculopathy, cervical region: Secondary | ICD-10-CM | POA: Diagnosis not present

## 2015-08-03 DIAGNOSIS — M542 Cervicalgia: Secondary | ICD-10-CM | POA: Diagnosis not present

## 2015-08-10 DIAGNOSIS — M4722 Other spondylosis with radiculopathy, cervical region: Secondary | ICD-10-CM | POA: Diagnosis not present

## 2015-08-10 DIAGNOSIS — M542 Cervicalgia: Secondary | ICD-10-CM | POA: Diagnosis not present

## 2015-08-19 DIAGNOSIS — H401131 Primary open-angle glaucoma, bilateral, mild stage: Secondary | ICD-10-CM | POA: Diagnosis not present

## 2015-08-23 DIAGNOSIS — M4722 Other spondylosis with radiculopathy, cervical region: Secondary | ICD-10-CM | POA: Diagnosis not present

## 2015-08-23 DIAGNOSIS — M542 Cervicalgia: Secondary | ICD-10-CM | POA: Diagnosis not present

## 2015-08-30 DIAGNOSIS — M4722 Other spondylosis with radiculopathy, cervical region: Secondary | ICD-10-CM | POA: Diagnosis not present

## 2015-09-06 DIAGNOSIS — M4722 Other spondylosis with radiculopathy, cervical region: Secondary | ICD-10-CM | POA: Diagnosis not present

## 2015-09-06 DIAGNOSIS — M542 Cervicalgia: Secondary | ICD-10-CM | POA: Diagnosis not present

## 2015-09-08 DIAGNOSIS — M4722 Other spondylosis with radiculopathy, cervical region: Secondary | ICD-10-CM | POA: Diagnosis not present

## 2015-09-08 DIAGNOSIS — M542 Cervicalgia: Secondary | ICD-10-CM | POA: Diagnosis not present

## 2015-09-09 DIAGNOSIS — M1610 Unilateral primary osteoarthritis, unspecified hip: Secondary | ICD-10-CM | POA: Diagnosis not present

## 2015-09-09 DIAGNOSIS — I10 Essential (primary) hypertension: Secondary | ICD-10-CM | POA: Diagnosis not present

## 2015-09-09 DIAGNOSIS — R7309 Other abnormal glucose: Secondary | ICD-10-CM | POA: Diagnosis not present

## 2015-09-15 DIAGNOSIS — H401122 Primary open-angle glaucoma, left eye, moderate stage: Secondary | ICD-10-CM | POA: Diagnosis not present

## 2015-09-20 ENCOUNTER — Encounter: Payer: Self-pay | Admitting: Obstetrics & Gynecology

## 2015-09-20 DIAGNOSIS — Z1231 Encounter for screening mammogram for malignant neoplasm of breast: Secondary | ICD-10-CM | POA: Diagnosis not present

## 2015-09-20 DIAGNOSIS — Z124 Encounter for screening for malignant neoplasm of cervix: Secondary | ICD-10-CM | POA: Diagnosis not present

## 2015-10-01 ENCOUNTER — Ambulatory Visit: Payer: PRIVATE HEALTH INSURANCE | Admitting: *Deleted

## 2015-11-19 DIAGNOSIS — M1611 Unilateral primary osteoarthritis, right hip: Secondary | ICD-10-CM | POA: Diagnosis not present

## 2015-11-24 DIAGNOSIS — Z23 Encounter for immunization: Secondary | ICD-10-CM | POA: Diagnosis not present

## 2016-02-15 DIAGNOSIS — H903 Sensorineural hearing loss, bilateral: Secondary | ICD-10-CM | POA: Diagnosis not present

## 2016-03-03 DIAGNOSIS — I10 Essential (primary) hypertension: Secondary | ICD-10-CM | POA: Diagnosis not present

## 2016-03-03 DIAGNOSIS — Z79899 Other long term (current) drug therapy: Secondary | ICD-10-CM | POA: Diagnosis not present

## 2016-03-03 DIAGNOSIS — E782 Mixed hyperlipidemia: Secondary | ICD-10-CM | POA: Diagnosis not present

## 2016-03-03 DIAGNOSIS — Z01818 Encounter for other preprocedural examination: Secondary | ICD-10-CM | POA: Diagnosis not present

## 2016-03-03 DIAGNOSIS — R7309 Other abnormal glucose: Secondary | ICD-10-CM | POA: Diagnosis not present

## 2016-03-03 DIAGNOSIS — Z01812 Encounter for preprocedural laboratory examination: Secondary | ICD-10-CM | POA: Diagnosis not present

## 2016-03-14 ENCOUNTER — Ambulatory Visit: Payer: Self-pay | Admitting: Orthopedic Surgery

## 2016-03-16 DIAGNOSIS — H401122 Primary open-angle glaucoma, left eye, moderate stage: Secondary | ICD-10-CM | POA: Diagnosis not present

## 2016-03-27 NOTE — Patient Instructions (Signed)
Marisa Holden  03/27/2016   Your procedure is scheduled on: 04/05/16  Report to [facility] Main  Entrance   To admitting at    1000AM.  Call this number if you have problems the morning of surgery [phone]   Remember: ONLY 1 PERSON MAY GO WITH YOU TO SHORT STAY TO GET  READY MORNING OF YOUR SURGERY.  Do not eat food or drink liquids :After Midnight.     Take these medicines the morning of surgery with A SIP OF WATER: NONE                               You may not have any metal on your body including hair pins and              piercings  Do not wear jewelry, make-up, lotions, powders or perfumes, deodorant             Do not wear nail polish.  Do not shave  48 hours prior to surgery.               Do not bring valuables to the hospital. [place].  Contacts, dentures or bridgework may not be worn into surgery.  Leave suitcase in the car. After surgery it may be brought to your room.                 Please read over the following fact sheets you were given: _____________________________________________________________________             [facility] - Preparing for Surgery Before surgery, you can play an important role.  Because skin is not sterile, your skin needs to be as free of germs as possible.  You can reduce the number of germs on your skin by washing with CHG (chlorahexidine gluconate) soap before surgery.  CHG is an antiseptic cleaner which kills germs and bonds with the skin to continue killing germs even after washing. Please DO NOT use if you have an allergy to CHG or antibacterial soaps.  If your skin becomes reddened/irritated stop using the CHG and inform your nurse when you arrive at Short Stay. Do not shave (including legs and underarms) for at least 48 hours prior to the first CHG shower.  You may shave your face/neck. Please follow these instructions carefully:  1.  Shower with CHG Soap  the night before surgery and the  morning of Surgery.  2.  If you choose to wash your hair, wash your hair first as usual with your  normal  shampoo.  3.  After you shampoo, rinse your hair and body thoroughly to remove the  shampoo.                           4.  Use CHG as you would any other liquid soap.  You can apply chg directly  to the skin and wash                       Gently with a scrungie or clean washcloth.  5.  Apply the CHG Soap to your body ONLY FROM THE NECK DOWN.   Do not use on face/  open                           Wound or open sores. Avoid contact with eyes, ears mouth and genitals (private parts).                       Wash face,  Genitals (private parts) with your normal soap.             6.  Wash thoroughly, paying special attention to the area where your surgery  will be performed.  7.  Thoroughly rinse your body with warm water from the neck down.  8.  DO NOT shower/wash with your normal soap after using and rinsing off  the CHG Soap.                9.  Pat yourself dry with a clean towel.            10.  Wear clean pajamas.            11.  Place clean sheets on your bed the night of your first shower and do not  sleep with pets. Day of Surgery : Do not apply any lotions/deodorants the morning of surgery.  Please wear clean clothes to the hospital/surgery center.  FAILURE TO FOLLOW THESE INSTRUCTIONS MAY RESULT IN THE CANCELLATION OF YOUR SURGERY PATIENT SIGNATURE_________________________________  NURSE SIGNATURE__________________________________  ________________________________________________________________________  WHAT IS A BLOOD TRANSFUSION? Blood Transfusion Information  A transfusion is the replacement of blood or some of its parts. Blood is made up of multiple cells which provide different functions.  Red blood cells carry oxygen and are used for blood loss replacement.  White blood cells fight against infection.  Platelets control bleeding.  Plasma  helps clot blood.  Other blood products are available for specialized needs, such as hemophilia or other clotting disorders. BEFORE THE TRANSFUSION  Who gives blood for transfusions?   Healthy volunteers who are fully evaluated to make sure their blood is safe. This is blood bank blood. Transfusion therapy is the safest it has ever been in the practice of medicine. Before blood is taken from a donor, a complete history is taken to make sure that person has no history of diseases nor engages in risky social behavior (examples are intravenous drug use or sexual activity with multiple partners). The donor's travel history is screened to minimize risk of transmitting infections, such as malaria. The donated blood is tested for signs of infectious diseases, such as HIV and hepatitis. The blood is then tested to be sure it is compatible with you in order to minimize the chance of a transfusion reaction. If you or a relative donates blood, this is often done in anticipation of surgery and is not appropriate for emergency situations. It takes many days to process the donated blood. RISKS AND COMPLICATIONS Although transfusion therapy is very safe and saves many lives, the main dangers of transfusion include:   Getting an infectious disease.  Developing a transfusion reaction. This is an allergic reaction to something in the blood you were given. Every precaution is taken to prevent this. The decision to have a blood transfusion has been considered carefully by your caregiver before blood is given. Blood is not given unless the benefits outweigh the risks. AFTER THE TRANSFUSION  Right after receiving a blood transfusion, you will usually feel much better and more energetic. This is especially true if your red blood  cells have gotten low (anemic). The transfusion raises the level of the red blood cells which carry oxygen, and this usually causes an energy increase.  The nurse administering the transfusion  will monitor you carefully for complications. HOME CARE INSTRUCTIONS  No special instructions are needed after a transfusion. You may find your energy is better. Speak with your caregiver about any limitations on activity for underlying diseases you may have. SEEK MEDICAL CARE IF:   Your condition is not improving after your transfusion.  You develop redness or irritation at the intravenous (IV) site. SEEK IMMEDIATE MEDICAL CARE IF:  Any of the following symptoms occur over the next 12 hours:  Shaking chills.  You have a temperature by mouth above 102 F (38.9 C), not controlled by medicine.  Chest, back, or muscle pain.  People around you feel you are not acting correctly or are confused.  Shortness of breath or difficulty breathing.  Dizziness and fainting.  You get a rash or develop hives.  You have a decrease in urine output.  Your urine turns a dark color or changes to pink, red, or brown. Any of the following symptoms occur over the next 10 days:  You have a temperature by mouth above 102 F (38.9 C), not controlled by medicine.  Shortness of breath.  Weakness after normal activity.  The white part of the eye turns yellow (jaundice).  You have a decrease in the amount of urine or are urinating less often.  Your urine turns a dark color or changes to pink, red, or brown. Document Released: 12/24/1999 Document Revised: 03/20/2011 Document Reviewed: 08/12/2007 ExitCare Patient Information 2014 [place].  _______________________________________________________________________  Incentive Spirometer  An incentive spirometer is a tool that can help keep your lungs clear and active. This tool measures how well you are filling your lungs with each breath. Taking long deep breaths may help reverse or decrease the chance of developing breathing (pulmonary) problems (especially infection) following:  A long period of time when you are unable to move or be  active. BEFORE THE PROCEDURE   If the spirometer includes an indicator to show your best effort, your nurse or respiratory therapist will set it to a desired goal.  If possible, sit up straight or lean slightly forward. Try not to slouch.  Hold the incentive spirometer in an upright position. INSTRUCTIONS FOR USE  1. Sit on the edge of your bed if possible, or sit up as far as you can in bed or on a chair. 2. Hold the incentive spirometer in an upright position. 3. Breathe out normally. 4. Place the mouthpiece in your mouth and seal your lips tightly around it. 5. Breathe in slowly and as deeply as possible, raising the piston or the ball toward the top of the column. 6. Hold your breath for 3-5 seconds or for as long as possible. Allow the piston or ball to fall to the bottom of the column. 7. Remove the mouthpiece from your mouth and breathe out normally. 8. Rest for a few seconds and repeat Steps 1 through 7 at least 10 times every 1-2 hours when you are awake. Take your time and take a few normal breaths between deep breaths. 9. The spirometer may include an indicator to show your best effort. Use the indicator as a goal to work toward during each repetition. 10. After each set of 10 deep breaths, practice coughing to be sure your lungs are clear. If you have an incision (the cut made  at the time of surgery), support your incision when coughing by placing a pillow or rolled up towels firmly against it. Once you are able to get out of bed, walk around indoors and cough well. You may stop using the incentive spirometer when instructed by your caregiver.  RISKS AND COMPLICATIONS  Take your time so you do not get dizzy or light-headed.  If you are in pain, you may need to take or ask for pain medication before doing incentive spirometry. It is harder to take a deep breath if you are having pain. AFTER USE  Rest and breathe slowly and easily.  It can be helpful to keep track of a log of  your progress. Your caregiver can provide you with a simple table to help with this. If you are using the spirometer at home, follow these instructions: [place] IF:   You are having difficultly using the spirometer.  You have trouble using the spirometer as often as instructed.  Your pain medication is not giving enough relief while using the spirometer.  You develop fever of 100.5 F (38.1 C) or higher. SEEK IMMEDIATE MEDICAL CARE IF:   You cough up bloody sputum that had not been present before.  You develop fever of 102 F (38.9 C) or greater.  You develop worsening pain at or near the incision site. MAKE SURE YOU:   Understand these instructions.  Will watch your condition.  Will get help right away if you are not doing well or get worse. Document Released: 05/08/2006 Document Revised: 03/20/2011 Document Reviewed: 07/09/2006 [facility] Patient Information 2014 [place], [place].   ________________________________________________________________________

## 2016-03-28 ENCOUNTER — Encounter (HOSPITAL_COMMUNITY): Payer: Self-pay

## 2016-03-28 ENCOUNTER — Encounter (HOSPITAL_COMMUNITY)
Admission: RE | Admit: 2016-03-28 | Discharge: 2016-03-28 | Disposition: A | Discharge status: Home / Self Care | Payer: Medicare Other | Source: Ambulatory Visit | Attending: Orthopedic Surgery | Admitting: Orthopedic Surgery

## 2016-03-28 DIAGNOSIS — Z01818 Encounter for other preprocedural examination: Secondary | ICD-10-CM | POA: Diagnosis not present

## 2016-03-28 DIAGNOSIS — M1611 Unilateral primary osteoarthritis, right hip: Secondary | ICD-10-CM | POA: Insufficient documentation

## 2016-03-28 HISTORY — DX: Anxiety disorder, unspecified: F41.9

## 2016-03-28 HISTORY — DX: Prediabetes: R73.03

## 2016-03-28 LAB — COMPREHENSIVE METABOLIC PANEL
ALT: 19 U/L (ref 14–54)
AST: 20 U/L (ref 15–41)
Albumin: 4.4 g/dL (ref 3.5–5.0)
Alkaline Phosphatase: 66 U/L (ref 38–126)
Anion gap: 8 (ref 5–15)
BILIRUBIN TOTAL: 0.8 mg/dL (ref 0.3–1.2)
BUN: 18 mg/dL (ref 6–20)
CALCIUM: 9.6 mg/dL (ref 8.9–10.3)
CO2: 26 mmol/L (ref 22–32)
CREATININE: 0.62 mg/dL (ref 0.44–1.00)
Chloride: 104 mmol/L (ref 101–111)
Glucose, Bld: 91 mg/dL (ref 65–99)
Potassium: 3.9 mmol/L (ref 3.5–5.1)
Sodium: 138 mmol/L (ref 135–145)
TOTAL PROTEIN: 7.4 g/dL (ref 6.5–8.1)

## 2016-03-28 LAB — CBC
HEMATOCRIT: 39.7 % (ref 36.0–46.0)
Hemoglobin: 13.4 g/dL (ref 12.0–15.0)
MCH: 31.9 pg (ref 26.0–34.0)
MCHC: 33.8 g/dL (ref 30.0–36.0)
MCV: 94.5 fL (ref 78.0–100.0)
Platelets: 202 10*3/uL (ref 150–400)
RBC: 4.2 MIL/uL (ref 3.87–5.11)
RDW: 12.9 % (ref 11.5–15.5)
WBC: 8.3 10*3/uL (ref 4.0–10.5)

## 2016-03-28 LAB — PROTIME-INR
INR: 1.01
PROTHROMBIN TIME: 13.3 s (ref 11.4–15.2)

## 2016-03-28 LAB — SURGICAL PCR SCREEN
MRSA, PCR: NEGATIVE
Staphylococcus aureus: NEGATIVE

## 2016-03-28 LAB — APTT: aPTT: 28 seconds (ref 24–36)

## 2016-03-28 LAB — ABO/RH: ABO/RH(D): O POS

## 2016-03-28 NOTE — Progress Notes (Signed)
Hgb 03/03/16 epic Dr. Stephanie Acre 03/03/16 clearance chart  EKG 2/23/218 chart Stress 03 chart

## 2016-04-04 ENCOUNTER — Ambulatory Visit: Payer: Self-pay | Admitting: Orthopedic Surgery

## 2016-04-04 NOTE — H&P (Signed)
Marisa Holden DOB: 1945-01-26 Married / Language: English / Race: White Female Date of Admission:  04/05/2016 CC:  Right Hip Pain History of Present Illness  The patient is a 71 year old female who comes in  for a preoperative History and Physical. The patient is scheduled for a right total hip arthroplasty (anterior) to be performed by Dr. Dione Plover. Aluisio, MD on 04-05-2016. The patient is a 71 year old female who presented with a hip problem. The patient reports right hip problems including pain symptoms that have been present for 1 year(s). The symptoms began without any known injury. Symptoms reported include pain in other joints, night pain, stiffness and difficulty ambulating The patient reports symptoms radiating to the: right groin, right thigh anteriorly and right thigh posteriorly. The patient describes the hip problem as aching. Onset of symptoms was with symptoms now occuring frequently.The patient feels as if their symptoms are does feel they are worsening. Symptoms are exacerbated by sitting and lying on the affected side. Prior to being seen the patient was previously evaluated by a colleague (Dr. Alfonso Ramus). Previous workup for this problem has included hip x-rays. Previous treatment for this problem has included application of ice, application of heat and corticosteroid injection (she states that she had a troch bursa injection first, which did not help. She later had intra-articular injection, over the summer, which helped for 3-4 months). Unfortunately, her hip has gotten progressively worse over the past year. She says that she is extremely healthy and wants to remain extremely active, but unfortunately the hip is preventing her from doing so. She has pain with all activities. She is even got to the point where she has some discomfort at night. It is limiting what she can and cannot do. She has pain in her groin which radiates into her thigh. She is not having any back pain, lower extremity  weakness or paresthesia. She has never had any problems with her left hip. She is ready to proceed with surgery at this time. They have been treated conservatively in the past for the above stated problem and despite conservative measures, they continue to have progressive pain and severe functional limitations and dysfunction. They have failed non-operative management including home exercise, medications. It is felt that they would benefit from undergoing total joint replacement. Risks and benefits of the procedure have been discussed with the patient and they elect to proceed with surgery. There are no active contraindications to surgery such as ongoing infection or rapidly progressive neurological disease.   Problem List/Past Medical  Primary osteoarthritis of right hip (M16.11)  High blood pressure  Impaired Vision  Impaired Hearing  Menopause  Allergies No Known Drug Allergies  Family History Diabetes Mellitus  Father, Mother. Heart Disease  Father, Mother.  Social History  Children  0 Current drinker  11/19/2015: Currently drinks wine 5-7 times per week Current work status  retired Exercise  Exercises rarely; does running / walking Living situation  live with spouse Marital status  married No history of drug/alcohol rehab  Not under pain contract  Number of flights of stairs before winded  greater than 5 Tobacco use  Former smoker. 11/19/2015 Advance Directives  Living Will  Medication History  Xanax (0.5MG  Tablet, Oral, Taken starting 03/10/2016) Active. Losartan Potassium-HCTZ (50-12.5MG  Tablet, Oral) Active. Latanoprost (0.005% Solution, Ophthalmic at bedtime) Active. Multivitamin Adult (Oral) Active.   Past Surgical History  Cataract Surgery  bilateral    Review of Systems General Not Present- Chills, Fatigue, Fever,  Memory Loss, Night Sweats, Weight Gain and Weight Loss. Skin Not Present- Eczema, Hives, Itching, Lesions and Rash. HEENT  Present- Hearing Loss. Not Present- Dentures, Double Vision, Headache, Tinnitus and Visual Loss. Respiratory Not Present- Allergies, Chronic Cough, Coughing up blood, Shortness of breath at rest and Shortness of breath with exertion. Cardiovascular Not Present- Chest Pain, Difficulty Breathing Lying Down, Murmur, Palpitations, Racing/skipping heartbeats and Swelling. Gastrointestinal Not Present- Abdominal Pain, Bloody Stool, Constipation, Diarrhea, Difficulty Swallowing, Heartburn, Jaundice, Loss of appetitie, Nausea and Vomiting. Female Genitourinary Not Present- Blood in Urine, Discharge, Flank Pain, Incontinence, Painful Urination, Urgency, Urinary frequency, Urinary Retention, Urinating at Night and Weak urinary stream. Musculoskeletal Present- Joint Pain. Not Present- Back Pain, Joint Swelling, Morning Stiffness, Muscle Pain, Muscle Weakness and Spasms. Neurological Not Present- Blackout spells, Difficulty with balance, Dizziness, Paralysis, Tremor and Weakness. Psychiatric Not Present- Insomnia.  Vitals Weight: 135 lb Height: 64in Body Surface Area: 1.66 m Body Mass Index: 23.17 kg/m  Pulse: 88 (Regular)  Resp.: 12 (Unlabored)  BP: 132/62 (Sitting, Right Arm, Standard)   Physical Exam General Mental Status -Alert, cooperative and good historian. General Appearance-pleasant, Not in acute distress. Orientation-Oriented X3. Build & Nutrition-Well nourished and Well developed.  Head and Neck Head-normocephalic, atraumatic . Neck Global Assessment - supple, no bruit auscultated on the right, no bruit auscultated on the left.  Eye Pupil - Bilateral-Regular and Round. Motion - Bilateral-EOMI.  ENMT Note: bilateral hearing aids   Chest and Lung Exam Auscultation Breath sounds - clear at anterior chest wall and clear at posterior chest wall. Adventitious sounds - No Adventitious sounds.  Cardiovascular Auscultation Rhythm - Regular rate and rhythm.  Heart Sounds - S1 WNL and S2 WNL. Murmurs & Other Heart Sounds - Auscultation of the heart reveals - No Murmurs.  Abdomen Palpation/Percussion Tenderness - Abdomen is non-tender to palpation. Rigidity (guarding) - Abdomen is soft. Auscultation Auscultation of the abdomen reveals - Bowel sounds normal.  Female Genitourinary Note: Not done, not pertinent to present illness   Musculoskeletal Note: Very pleasant, well developed female, alert and oriented, in no apparent distress. Her left hip can be flexed to 120, rotated in 30, out 40, abduct to 40 without discomfort. Right hip flexion to 100, about 10 degrees of internal rotation, 20/30 degrees of external rotation, 30 degrees of abduction. She does have pain on range of motion in that right hip. Gait pattern is antalgic on the right.  RADIOGRAPHS AP pelvis, AP and lateral of the right hip taken today show bone on bone arthritis in that right hip. She has subchondral cystic formation.  Assessment & Plan Primary osteoarthritis of right hip (M16.11)  Note:Surgical Plans: Right Total Hip Replacement - Anterior Approach  Disposition: Home with family, In-Home VERA Therapy  PCP: Dr. Jonathon Jordan - Patient has been seen preoperatively and felt to be stable for surgery.  IV TXA  Anesthesia Issues: None  Patient was instructed on what medications to stop prior to surgery.  Signed electronically by Joelene Millin, III PA-C

## 2016-04-05 ENCOUNTER — Encounter (HOSPITAL_COMMUNITY)
Admission: RE | Disposition: A | Discharge status: Home / Self Care | Payer: Self-pay | Source: Ambulatory Visit | Attending: Orthopedic Surgery

## 2016-04-05 ENCOUNTER — Inpatient Hospital Stay (HOSPITAL_COMMUNITY): Payer: Medicare Other | Admitting: Anesthesiology

## 2016-04-05 ENCOUNTER — Inpatient Hospital Stay (HOSPITAL_COMMUNITY): Payer: Medicare Other

## 2016-04-05 ENCOUNTER — Encounter (HOSPITAL_COMMUNITY): Payer: Self-pay | Admitting: Anesthesiology

## 2016-04-05 ENCOUNTER — Inpatient Hospital Stay (HOSPITAL_COMMUNITY)
Admission: RE | Admit: 2016-04-05 | Discharge: 2016-04-07 | DRG: 470 | Disposition: A | Discharge status: Home / Self Care | Payer: Medicare Other | Source: Ambulatory Visit | Attending: Orthopedic Surgery | Admitting: Orthopedic Surgery

## 2016-04-05 DIAGNOSIS — Z9842 Cataract extraction status, left eye: Secondary | ICD-10-CM

## 2016-04-05 DIAGNOSIS — Z96649 Presence of unspecified artificial hip joint: Secondary | ICD-10-CM

## 2016-04-05 DIAGNOSIS — I10 Essential (primary) hypertension: Secondary | ICD-10-CM | POA: Diagnosis present

## 2016-04-05 DIAGNOSIS — Z79899 Other long term (current) drug therapy: Secondary | ICD-10-CM | POA: Diagnosis not present

## 2016-04-05 DIAGNOSIS — H9190 Unspecified hearing loss, unspecified ear: Secondary | ICD-10-CM | POA: Diagnosis present

## 2016-04-05 DIAGNOSIS — Z9841 Cataract extraction status, right eye: Secondary | ICD-10-CM | POA: Diagnosis not present

## 2016-04-05 DIAGNOSIS — Z8249 Family history of ischemic heart disease and other diseases of the circulatory system: Secondary | ICD-10-CM

## 2016-04-05 DIAGNOSIS — Z833 Family history of diabetes mellitus: Secondary | ICD-10-CM

## 2016-04-05 DIAGNOSIS — M25711 Osteophyte, right shoulder: Secondary | ICD-10-CM | POA: Diagnosis not present

## 2016-04-05 DIAGNOSIS — M1611 Unilateral primary osteoarthritis, right hip: Secondary | ICD-10-CM | POA: Diagnosis not present

## 2016-04-05 DIAGNOSIS — Z471 Aftercare following joint replacement surgery: Secondary | ICD-10-CM | POA: Diagnosis not present

## 2016-04-05 DIAGNOSIS — Z87891 Personal history of nicotine dependence: Secondary | ICD-10-CM | POA: Diagnosis not present

## 2016-04-05 DIAGNOSIS — M169 Osteoarthritis of hip, unspecified: Secondary | ICD-10-CM

## 2016-04-05 DIAGNOSIS — R7303 Prediabetes: Secondary | ICD-10-CM | POA: Diagnosis not present

## 2016-04-05 DIAGNOSIS — Z96641 Presence of right artificial hip joint: Secondary | ICD-10-CM | POA: Diagnosis not present

## 2016-04-05 HISTORY — PX: TOTAL HIP ARTHROPLASTY: SHX124

## 2016-04-05 LAB — TYPE AND SCREEN
ABO/RH(D): O POS
Antibody Screen: NEGATIVE

## 2016-04-05 SURGERY — ARTHROPLASTY, HIP, TOTAL, ANTERIOR APPROACH
Anesthesia: Spinal | Site: Hip | Laterality: Right

## 2016-04-05 MED ORDER — METHOCARBAMOL 500 MG PO TABS
500.0000 mg | ORAL_TABLET | Freq: Four times a day (QID) | ORAL | Status: DC | PRN
  Administered 2016-04-06 – 2016-04-07 (×3): 500 mg via ORAL
  Filled 2016-04-05 (×3): qty 1

## 2016-04-05 MED ORDER — METOCLOPRAMIDE HCL 5 MG/ML IJ SOLN: 5.0000 mg | Freq: Three times a day (TID) | INTRAMUSCULAR | Status: DC | PRN

## 2016-04-05 MED ORDER — BUPIVACAINE HCL (PF) 0.25 % IJ SOLN
INTRAMUSCULAR | Status: AC
  Filled 2016-04-05: qty 30

## 2016-04-05 MED ORDER — ACETAMINOPHEN 650 MG RE SUPP: 650.0000 mg | Freq: Four times a day (QID) | RECTAL | Status: DC | PRN

## 2016-04-05 MED ORDER — PHENOL 1.4 % MT LIQD
1.0000 | OROMUCOSAL | Status: DC | PRN
  Filled 2016-04-05: qty 177

## 2016-04-05 MED ORDER — PHENYLEPHRINE 40 MCG/ML (10ML) SYRINGE FOR IV PUSH (FOR BLOOD PRESSURE SUPPORT)
PREFILLED_SYRINGE | INTRAVENOUS | Status: AC
  Filled 2016-04-05: qty 10

## 2016-04-05 MED ORDER — PROPOFOL 500 MG/50ML IV EMUL
INTRAVENOUS | Status: DC | PRN
  Administered 2016-04-05: 75 ug/kg/min via INTRAVENOUS

## 2016-04-05 MED ORDER — ACETAMINOPHEN 10 MG/ML IV SOLN
1000.0000 mg | Freq: Once | INTRAVENOUS | Status: AC
  Administered 2016-04-05: 1000 mg via INTRAVENOUS

## 2016-04-05 MED ORDER — ACETAMINOPHEN 10 MG/ML IV SOLN
INTRAVENOUS | Status: AC
  Filled 2016-04-05: qty 100

## 2016-04-05 MED ORDER — MORPHINE SULFATE (PF) 4 MG/ML IV SOLN
1.0000 mg | INTRAVENOUS | Status: DC | PRN
  Administered 2016-04-05: 1 mg via INTRAVENOUS
  Filled 2016-04-05: qty 1

## 2016-04-05 MED ORDER — BUPIVACAINE HCL (PF) 0.5 % IJ SOLN
INTRAMUSCULAR | Status: DC | PRN
  Administered 2016-04-05: 3 mL via INTRATHECAL

## 2016-04-05 MED ORDER — DEXAMETHASONE SODIUM PHOSPHATE 10 MG/ML IJ SOLN
10.0000 mg | Freq: Once | INTRAMUSCULAR | Status: AC
  Administered 2016-04-05: 10 mg via INTRAVENOUS

## 2016-04-05 MED ORDER — MIDAZOLAM HCL 2 MG/2ML IJ SOLN
INTRAMUSCULAR | Status: AC
  Filled 2016-04-05: qty 2

## 2016-04-05 MED ORDER — STERILE WATER FOR IRRIGATION IR SOLN
Status: DC | PRN
  Administered 2016-04-05: 2000 mL

## 2016-04-05 MED ORDER — CEFAZOLIN SODIUM-DEXTROSE 2-4 GM/100ML-% IV SOLN
2.0000 g | INTRAVENOUS | Status: AC
  Administered 2016-04-05: 2 g via INTRAVENOUS

## 2016-04-05 MED ORDER — ONDANSETRON HCL 4 MG PO TABS: 4.0000 mg | ORAL_TABLET | Freq: Four times a day (QID) | ORAL | Status: DC | PRN

## 2016-04-05 MED ORDER — HYDROMORPHONE HCL 1 MG/ML IJ SOLN
0.2500 mg | INTRAMUSCULAR | Status: DC | PRN
  Administered 2016-04-05 (×2): 0.25 mg via INTRAVENOUS

## 2016-04-05 MED ORDER — HYDROMORPHONE HCL 1 MG/ML IJ SOLN
INTRAMUSCULAR | Status: AC
  Administered 2016-04-05: 0.25 mg via INTRAVENOUS
  Filled 2016-04-05: qty 1

## 2016-04-05 MED ORDER — LOSARTAN POTASSIUM-HCTZ 50-12.5 MG PO TABS: 1.0000 | ORAL_TABLET | Freq: Every day | ORAL | Status: DC

## 2016-04-05 MED ORDER — ONDANSETRON HCL 4 MG/2ML IJ SOLN
INTRAMUSCULAR | Status: AC
  Filled 2016-04-05: qty 2

## 2016-04-05 MED ORDER — SODIUM CHLORIDE 0.9 % IV SOLN
INTRAVENOUS | Status: DC
  Administered 2016-04-05 – 2016-04-06 (×3): via INTRAVENOUS

## 2016-04-05 MED ORDER — ALPRAZOLAM 0.5 MG PO TABS: 0.5000 mg | ORAL_TABLET | Freq: Two times a day (BID) | ORAL | Status: DC | PRN

## 2016-04-05 MED ORDER — RIVAROXABAN 10 MG PO TABS
10.0000 mg | ORAL_TABLET | Freq: Every day | ORAL | Status: DC
  Administered 2016-04-06 – 2016-04-07 (×2): 10 mg via ORAL
  Filled 2016-04-05 (×2): qty 1

## 2016-04-05 MED ORDER — PROPOFOL 10 MG/ML IV BOLUS
INTRAVENOUS | Status: AC
  Filled 2016-04-05: qty 20

## 2016-04-05 MED ORDER — LATANOPROST 0.005 % OP SOLN
1.0000 [drp] | Freq: Every day | OPHTHALMIC | Status: DC
  Administered 2016-04-05 – 2016-04-06 (×2): 1 [drp] via OPHTHALMIC
  Filled 2016-04-05: qty 2.5

## 2016-04-05 MED ORDER — BISACODYL 10 MG RE SUPP: 10.0000 mg | Freq: Every day | RECTAL | Status: DC | PRN

## 2016-04-05 MED ORDER — CHLORHEXIDINE GLUCONATE 4 % EX LIQD: 60.0000 mL | Freq: Once | CUTANEOUS | Status: DC

## 2016-04-05 MED ORDER — PROMETHAZINE HCL 25 MG/ML IJ SOLN: 6.2500 mg | INTRAMUSCULAR | Status: DC | PRN

## 2016-04-05 MED ORDER — DOCUSATE SODIUM 100 MG PO CAPS
100.0000 mg | ORAL_CAPSULE | Freq: Two times a day (BID) | ORAL | Status: DC
  Administered 2016-04-05 – 2016-04-07 (×4): 100 mg via ORAL
  Filled 2016-04-05 (×4): qty 1

## 2016-04-05 MED ORDER — EPHEDRINE SULFATE-NACL 50-0.9 MG/10ML-% IV SOSY
PREFILLED_SYRINGE | INTRAVENOUS | Status: DC | PRN
  Administered 2016-04-05 (×3): 5 mg via INTRAVENOUS

## 2016-04-05 MED ORDER — FENTANYL CITRATE (PF) 100 MCG/2ML IJ SOLN
INTRAMUSCULAR | Status: AC
  Filled 2016-04-05: qty 2

## 2016-04-05 MED ORDER — DEXAMETHASONE SODIUM PHOSPHATE 10 MG/ML IJ SOLN
10.0000 mg | Freq: Once | INTRAMUSCULAR | Status: AC
  Administered 2016-04-06: 10 mg via INTRAVENOUS
  Filled 2016-04-05: qty 1

## 2016-04-05 MED ORDER — ONDANSETRON HCL 4 MG/2ML IJ SOLN
4.0000 mg | Freq: Four times a day (QID) | INTRAMUSCULAR | Status: DC | PRN
  Administered 2016-04-06: 4 mg via INTRAVENOUS
  Filled 2016-04-05: qty 2

## 2016-04-05 MED ORDER — DEXAMETHASONE SODIUM PHOSPHATE 10 MG/ML IJ SOLN
INTRAMUSCULAR | Status: AC
  Filled 2016-04-05: qty 1

## 2016-04-05 MED ORDER — CEFAZOLIN SODIUM-DEXTROSE 2-4 GM/100ML-% IV SOLN
INTRAVENOUS | Status: AC
  Filled 2016-04-05: qty 100

## 2016-04-05 MED ORDER — FLEET ENEMA 7-19 GM/118ML RE ENEM: 1.0000 | ENEMA | Freq: Once | RECTAL | Status: DC | PRN

## 2016-04-05 MED ORDER — PHENYLEPHRINE HCL 10 MG/ML IJ SOLN
INTRAVENOUS | Status: DC | PRN
  Administered 2016-04-05: 15 ug/min via INTRAVENOUS

## 2016-04-05 MED ORDER — PHENYLEPHRINE 40 MCG/ML (10ML) SYRINGE FOR IV PUSH (FOR BLOOD PRESSURE SUPPORT)
PREFILLED_SYRINGE | INTRAVENOUS | Status: DC | PRN
Start: 2016-04-05 — End: 2016-04-05
  Administered 2016-04-05 (×5): 80 ug via INTRAVENOUS

## 2016-04-05 MED ORDER — MENTHOL 3 MG MT LOZG: 1.0000 | LOZENGE | OROMUCOSAL | Status: DC | PRN

## 2016-04-05 MED ORDER — LACTATED RINGERS IV SOLN
INTRAVENOUS | Status: DC
  Administered 2016-04-05 (×4): via INTRAVENOUS

## 2016-04-05 MED ORDER — POLYETHYLENE GLYCOL 3350 17 G PO PACK: 17.0000 g | PACK | Freq: Every day | ORAL | Status: DC | PRN

## 2016-04-05 MED ORDER — DIPHENHYDRAMINE HCL 12.5 MG/5ML PO ELIX: 12.5000 mg | ORAL_SOLUTION | ORAL | Status: DC | PRN

## 2016-04-05 MED ORDER — METOCLOPRAMIDE HCL 5 MG PO TABS: 5.0000 mg | ORAL_TABLET | Freq: Three times a day (TID) | ORAL | Status: DC | PRN

## 2016-04-05 MED ORDER — TRANEXAMIC ACID 1000 MG/10ML IV SOLN
1000.0000 mg | Freq: Once | INTRAVENOUS | Status: AC
  Administered 2016-04-05: 1000 mg via INTRAVENOUS
  Filled 2016-04-05: qty 1100

## 2016-04-05 MED ORDER — ACETAMINOPHEN 500 MG PO TABS
1000.0000 mg | ORAL_TABLET | Freq: Four times a day (QID) | ORAL | Status: AC
  Administered 2016-04-05 – 2016-04-06 (×4): 1000 mg via ORAL
  Filled 2016-04-05 (×4): qty 2

## 2016-04-05 MED ORDER — BUPIVACAINE HCL (PF) 0.25 % IJ SOLN
INTRAMUSCULAR | Status: DC | PRN
  Administered 2016-04-05: 30 mL

## 2016-04-05 MED ORDER — PHENYLEPHRINE HCL 10 MG/ML IJ SOLN
INTRAMUSCULAR | Status: AC
  Filled 2016-04-05: qty 1

## 2016-04-05 MED ORDER — TRANEXAMIC ACID 1000 MG/10ML IV SOLN
1000.0000 mg | INTRAVENOUS | Status: AC
  Administered 2016-04-05: 1000 mg via INTRAVENOUS
  Filled 2016-04-05: qty 1100

## 2016-04-05 MED ORDER — HYDROCHLOROTHIAZIDE 12.5 MG PO CAPS
12.5000 mg | ORAL_CAPSULE | Freq: Every day | ORAL | Status: DC
  Filled 2016-04-05 (×2): qty 1

## 2016-04-05 MED ORDER — METHOCARBAMOL 1000 MG/10ML IJ SOLN
500.0000 mg | Freq: Four times a day (QID) | INTRAVENOUS | Status: DC | PRN
  Administered 2016-04-05: 500 mg via INTRAVENOUS
  Filled 2016-04-05: qty 5
  Filled 2016-04-05: qty 550

## 2016-04-05 MED ORDER — ACETAMINOPHEN 325 MG PO TABS: 650.0000 mg | ORAL_TABLET | Freq: Four times a day (QID) | ORAL | Status: DC | PRN

## 2016-04-05 MED ORDER — LOSARTAN POTASSIUM 50 MG PO TABS
50.0000 mg | ORAL_TABLET | Freq: Every day | ORAL | Status: DC
  Filled 2016-04-05 (×2): qty 1

## 2016-04-05 MED ORDER — FENTANYL CITRATE (PF) 100 MCG/2ML IJ SOLN
INTRAMUSCULAR | Status: DC | PRN
  Administered 2016-04-05 (×2): 50 ug via INTRAVENOUS

## 2016-04-05 MED ORDER — 0.9 % SODIUM CHLORIDE (POUR BTL) OPTIME
TOPICAL | Status: DC | PRN
Start: 2016-04-05 — End: 2016-04-05
  Administered 2016-04-05: 1000 mL

## 2016-04-05 MED ORDER — CEFAZOLIN SODIUM-DEXTROSE 2-4 GM/100ML-% IV SOLN
2.0000 g | Freq: Four times a day (QID) | INTRAVENOUS | Status: AC
  Administered 2016-04-05 (×2): 2 g via INTRAVENOUS
  Filled 2016-04-05 (×2): qty 100

## 2016-04-05 MED ORDER — ONDANSETRON HCL 4 MG/2ML IJ SOLN
INTRAMUSCULAR | Status: DC | PRN
  Administered 2016-04-05: 4 mg via INTRAVENOUS

## 2016-04-05 MED ORDER — MIDAZOLAM HCL 5 MG/5ML IJ SOLN
INTRAMUSCULAR | Status: DC | PRN
  Administered 2016-04-05: 2 mg via INTRAVENOUS

## 2016-04-05 MED ORDER — OXYCODONE HCL 5 MG PO TABS
5.0000 mg | ORAL_TABLET | ORAL | Status: DC | PRN
  Administered 2016-04-05 (×2): 10 mg via ORAL
  Administered 2016-04-05: 5 mg via ORAL
  Administered 2016-04-06 (×3): 10 mg via ORAL
  Administered 2016-04-06: 5 mg via ORAL
  Administered 2016-04-06 – 2016-04-07 (×3): 10 mg via ORAL
  Filled 2016-04-05: qty 1
  Filled 2016-04-05 (×7): qty 2
  Filled 2016-04-05: qty 1
  Filled 2016-04-05: qty 2

## 2016-04-05 SURGICAL SUPPLY — 36 items
BAG DECANTER FOR FLEXI CONT (MISCELLANEOUS) ×2 IMPLANT
BAG ZIPLOCK 12X15 (MISCELLANEOUS) IMPLANT
BLADE SAG 18X100X1.27 (BLADE) ×2 IMPLANT
CAPT HIP TOTAL 2 ×2 IMPLANT
CLOTH BEACON ORANGE TIMEOUT ST (SAFETY) ×2 IMPLANT
COVER PERINEAL POST (MISCELLANEOUS) ×2 IMPLANT
DECANTER SPIKE VIAL GLASS SM (MISCELLANEOUS) ×2 IMPLANT
DRAPE STERI IOBAN 125X83 (DRAPES) ×2 IMPLANT
DRAPE U-SHAPE 47X51 STRL (DRAPES) ×4 IMPLANT
DRSG ADAPTIC 3X8 NADH LF (GAUZE/BANDAGES/DRESSINGS) ×2 IMPLANT
DRSG MEPILEX BORDER 4X4 (GAUZE/BANDAGES/DRESSINGS) ×2 IMPLANT
DRSG MEPILEX BORDER 4X8 (GAUZE/BANDAGES/DRESSINGS) ×2 IMPLANT
DURAPREP 26ML APPLICATOR (WOUND CARE) ×2 IMPLANT
ELECT REM PT RETURN 15FT ADLT (MISCELLANEOUS) ×2 IMPLANT
EVACUATOR 1/8 PVC DRAIN (DRAIN) ×2 IMPLANT
GLOVE BIO SURGEON STRL SZ7.5 (GLOVE) ×2 IMPLANT
GLOVE BIO SURGEON STRL SZ8 (GLOVE) ×4 IMPLANT
GLOVE BIOGEL PI IND STRL 7.5 (GLOVE) ×4 IMPLANT
GLOVE BIOGEL PI IND STRL 8 (GLOVE) ×2 IMPLANT
GLOVE BIOGEL PI INDICATOR 7.5 (GLOVE) ×4
GLOVE BIOGEL PI INDICATOR 8 (GLOVE) ×2
GLOVE SURG SS PI 7.5 STRL IVOR (GLOVE) ×4 IMPLANT
GOWN STRL REUS W/ TWL XL LVL3 (GOWN DISPOSABLE) ×1 IMPLANT
GOWN STRL REUS W/TWL LRG LVL3 (GOWN DISPOSABLE) ×2 IMPLANT
GOWN STRL REUS W/TWL XL LVL3 (GOWN DISPOSABLE) ×5 IMPLANT
PACK ANTERIOR HIP CUSTOM (KITS) ×2 IMPLANT
STRIP CLOSURE SKIN 1/2X4 (GAUZE/BANDAGES/DRESSINGS) ×2 IMPLANT
SUT ETHIBOND NAB CT1 #1 30IN (SUTURE) ×2 IMPLANT
SUT MNCRL AB 4-0 PS2 18 (SUTURE) ×2 IMPLANT
SUT STRATAFIX 0 PDS 27 VIOLET (SUTURE) ×2
SUT VIC AB 2-0 CT1 27 (SUTURE) ×2
SUT VIC AB 2-0 CT1 TAPERPNT 27 (SUTURE) ×2 IMPLANT
SUTURE STRATFX 0 PDS 27 VIOLET (SUTURE) ×1 IMPLANT
SYR 50ML LL SCALE MARK (SYRINGE) IMPLANT
TRAY FOLEY CATH SILVER 14FR (SET/KITS/TRAYS/PACK) ×2 IMPLANT
YANKAUER SUCT BULB TIP 10FT TU (MISCELLANEOUS) ×2 IMPLANT

## 2016-04-05 NOTE — Op Note (Signed)
OPERATIVE REPORT- TOTAL HIP ARTHROPLASTY   PREOPERATIVE DIAGNOSIS: Osteoarthritis of the Right hip.   POSTOPERATIVE DIAGNOSIS: Osteoarthritis of the Right  hip.   PROCEDURE: Right total hip arthroplasty, anterior approach.   SURGEON: Gaynelle Arabian, MD   ASSISTANT: Arlee Muslim, PA-C  ANESTHESIA:  Spinal  ESTIMATED BLOOD LOSS:- 250 ml    DRAINS: Hemovac x1.   COMPLICATIONS: None   CONDITION: PACU - hemodynamically stable.   BRIEF CLINICAL NOTE: Marisa Holden is a 71 y.o. female who has advanced end-  stage arthritis of their Right  hip with progressively worsening pain and  dysfunction.The patient has failed nonoperative management and presents for  total hip arthroplasty.   PROCEDURE IN DETAIL: After successful administration of spinal  anesthetic, the traction boots for the [facility] bed were placed on both  feet and the patient was placed onto the [facility] bed, boots placed into the leg  holders. The Right hip was then isolated from the perineum with plastic  drapes and prepped and draped in the usual sterile fashion. ASIS and  greater trochanter were marked and a oblique incision was made, starting  at about 1 cm lateral and 2 cm distal to the ASIS and coursing towards  the anterior cortex of the femur. The skin was cut with a 10 blade  through subcutaneous tissue to the level of the fascia overlying the  tensor fascia lata muscle. The fascia was then incised in line with the  incision at the junction of the anterior third and posterior 2/3rd. The  muscle was teased off the fascia and then the interval between the TFL  and the rectus was developed. The Hohmann retractor was then placed at  the top of the femoral neck over the capsule. The vessels overlying the  capsule were cauterized and the fat on top of the capsule was removed.  A Hohmann retractor was then placed anterior underneath the rectus  femoris to give exposure to the entire anterior capsule. A T-shaped   capsulotomy was performed. The edges were tagged and the femoral head  was identified.       Osteophytes are removed off the superior acetabulum.  The femoral neck was then cut in situ with an oscillating saw. Traction  was then applied to the left lower extremity utilizing the [facility]  traction. The femoral head was then removed. Retractors were placed  around the acetabulum and then circumferential removal of the labrum was  performed. Osteophytes were also removed. Reaming starts at 45 mm to  medialize and  Increased in 2 mm increments to 47 mm. We reamed in  approximately 40 degrees of abduction, 20 degrees anteversion. A 48 mm  pinnacle acetabular shell was then impacted in anatomic position under  fluoroscopic guidance with excellent purchase. We did not need to place  any additional dome screws. A 28 mm neutral + 4 marathon liner was then  placed into the acetabular shell.       The femoral lift was then placed along the lateral aspect of the femur  just distal to the vastus ridge. The leg was  externally rotated and capsule  was stripped off the inferior aspect of the femoral neck down to the  level of the lesser trochanter, this was done with electrocautery. The femur was lifted after this was performed. The  leg was then placed in an extended and adducted position essentially delivering the femur. We also removed the capsule superiorly and the piriformis from the  piriformis fossa to gain excellent exposure of the  proximal femur. Rongeur was used to remove some cancellous bone to get  into the lateral portion of the proximal femur for placement of the  initial starter reamer. The starter broaches was placed  the starter broach  and was shown to go down the center of the canal. Broaching  with the  Corail system was then performed starting at size 8, coursing  Up to size 9. A size 9 had excellent torsional and rotational  and axial stability. The trial high offset neck was then placed   with a 28 + 5 trial head. The hip was then reduced. We confirmed that  the stem was in the canal both on AP and lateral x-rays. It also has excellent sizing. The hip was reduced with outstanding stability through full extension and full external rotation.. AP pelvis was taken and the leg lengths were measured and found to be equal. Hip was then dislocated again and the femoral head and neck removed. The  femoral broach was removed. Size 9 Corail stem with a high offset  neck was then impacted into the femur following native anteversion. Has  excellent purchase in the canal. Excellent torsional and rotational and  axial stability. It is confirmed to be in the canal on AP and lateral  fluoroscopic views. The 28 + 5 ceramic head was placed and the hip  reduced with outstanding stability. Again AP pelvis was taken and it  confirmed that the leg lengths were equal. The wound was then copiously  irrigated with saline solution and the capsule reattached and repaired  with Ethibond suture. 30 ml of .25% Bupivicaine was  injected into the capsule and into the edge of the tensor fascia lata as well as subcutaneous tissue. The fascia overlying the tensor fascia lata was then closed with a running #1 V-Loc. Subcu was closed with interrupted 2-0 Vicryl and subcuticular running 4-0 Monocryl. Incision was cleaned  and dried. Steri-Strips and a bulky sterile dressing applied. Hemovac  drain was hooked to suction and then the patient was awakened and transported to  recovery in stable condition.        Please note that a surgical assistant was a medical necessity for this procedure to perform it in a safe and expeditious manner. Assistant was necessary to provide appropriate retraction of vital neurovascular structures and to prevent femoral fracture and allow for anatomic placement of the prosthesis.  Gaynelle Arabian, M.D.

## 2016-04-05 NOTE — Anesthesia Postprocedure Evaluation (Signed)
Anesthesia Post Note  Patient: Marisa Holden  Procedure(s) Performed: Procedure(s) (LRB): RIGHT TOTAL HIP ARTHROPLASTY ANTERIOR APPROACH (Right)  Patient location during evaluation: PACU Anesthesia Type: Spinal Level of consciousness: oriented and awake and alert Pain management: pain level controlled Vital Signs Assessment: post-procedure vital signs reviewed and stable Respiratory status: spontaneous breathing and respiratory function stable Cardiovascular status: blood pressure returned to baseline and stable Postop Assessment: no headache and no backache Anesthetic complications: no       Last Vitals:  Vitals:   04/05/16 1734 04/05/16 1831  BP: 131/75 (!) 129/56  Pulse: 100 96  Resp: 16 16  Temp: 36.3 C 36.5 C    Last Pain:  Vitals:   04/05/16 1831  TempSrc: Oral  PainSc:                  Lynda Rainwater

## 2016-04-05 NOTE — Anesthesia Preprocedure Evaluation (Signed)
Anesthesia Evaluation  Patient identified by MRN, date of birth, ID band Patient awake    Reviewed: Allergy & Precautions, NPO status , Patient's Chart, lab work & pertinent test results  Airway Mallampati: II  TM Distance: >3 FB Neck ROM: Full    Dental no notable dental hx.    Pulmonary neg pulmonary ROS, former smoker,    Pulmonary exam normal breath sounds clear to auscultation       Cardiovascular hypertension, Normal cardiovascular exam Rhythm:Regular Rate:Normal     Neuro/Psych negative neurological ROS  negative psych ROS   GI/Hepatic negative GI ROS, Neg liver ROS,   Endo/Other  negative endocrine ROS  Renal/GU negative Renal ROS  negative genitourinary   Musculoskeletal negative musculoskeletal ROS (+)   Abdominal   Peds negative pediatric ROS (+)  Hematology negative hematology ROS (+)   Anesthesia Other Findings   Reproductive/Obstetrics negative OB ROS                             Anesthesia Physical Anesthesia Plan  ASA: II  Anesthesia Plan: Spinal   Post-op Pain Management:    Induction: Intravenous  Airway Management Planned: Simple Face Mask  Additional Equipment:   Intra-op Plan:   Post-operative Plan:   Informed Consent: I have reviewed the patients History and Physical, chart, labs and discussed the procedure including the risks, benefits and alternatives for the proposed anesthesia with the patient or authorized representative who has indicated his/her understanding and acceptance.   Dental advisory given  Plan Discussed with: CRNA and Surgeon  Anesthesia Plan Comments:         Anesthesia Quick Evaluation

## 2016-04-05 NOTE — H&P (View-Only) (Signed)
Marisa Holden DOB: 03-25-1945 Married / Language: English / Race: White Female Date of Admission:  04/05/2016 CC:  Right Hip Pain History of Present Illness  The patient is a 71 year old female who comes in  for a preoperative History and Physical. The patient is scheduled for a right total hip arthroplasty (anterior) to be performed by Dr. Dione Plover. Aluisio, MD on 04-05-2016. The patient is a 71 year old female who presented with a hip problem. The patient reports right hip problems including pain symptoms that have been present for 1 year(s). The symptoms began without any known injury. Symptoms reported include pain in other joints, night pain, stiffness and difficulty ambulating The patient reports symptoms radiating to the: right groin, right thigh anteriorly and right thigh posteriorly. The patient describes the hip problem as aching. Onset of symptoms was with symptoms now occuring frequently.The patient feels as if their symptoms are does feel they are worsening. Symptoms are exacerbated by sitting and lying on the affected side. Prior to being seen the patient was previously evaluated by a colleague (Dr. Alfonso Ramus). Previous workup for this problem has included hip x-rays. Previous treatment for this problem has included application of ice, application of heat and corticosteroid injection (she states that she had a troch bursa injection first, which did not help. She later had intra-articular injection, over the summer, which helped for 3-4 months). Unfortunately, her hip has gotten progressively worse over the past year. She says that she is extremely healthy and wants to remain extremely active, but unfortunately the hip is preventing her from doing so. She has pain with all activities. She is even got to the point where she has some discomfort at night. It is limiting what she can and cannot do. She has pain in her groin which radiates into her thigh. She is not having any back pain, lower extremity  weakness or paresthesia. She has never had any problems with her left hip. She is ready to proceed with surgery at this time. They have been treated conservatively in the past for the above stated problem and despite conservative measures, they continue to have progressive pain and severe functional limitations and dysfunction. They have failed non-operative management including home exercise, medications. It is felt that they would benefit from undergoing total joint replacement. Risks and benefits of the procedure have been discussed with the patient and they elect to proceed with surgery. There are no active contraindications to surgery such as ongoing infection or rapidly progressive neurological disease.   Problem List/Past Medical  Primary osteoarthritis of right hip (M16.11)  High blood pressure  Impaired Vision  Impaired Hearing  Menopause  Allergies No Known Drug Allergies  Family History Diabetes Mellitus  Father, Mother. Heart Disease  Father, Mother.  Social History  Children  0 Current drinker  11/19/2015: Currently drinks wine 5-7 times per week Current work status  retired Exercise  Exercises rarely; does running / walking Living situation  live with spouse Marital status  married No history of drug/alcohol rehab  Not under pain contract  Number of flights of stairs before winded  greater than 5 Tobacco use  Former smoker. 11/19/2015 Advance Directives  Living Will  Medication History  Xanax (0.5MG  Tablet, Oral, Taken starting 03/10/2016) Active. Losartan Potassium-HCTZ (50-12.5MG  Tablet, Oral) Active. Latanoprost (0.005% Solution, Ophthalmic at bedtime) Active. Multivitamin Adult (Oral) Active.   Past Surgical History  Cataract Surgery  bilateral    Review of Systems General Not Present- Chills, Fatigue, Fever,  Memory Loss, Night Sweats, Weight Gain and Weight Loss. Skin Not Present- Eczema, Hives, Itching, Lesions and Rash. HEENT  Present- Hearing Loss. Not Present- Dentures, Double Vision, Headache, Tinnitus and Visual Loss. Respiratory Not Present- Allergies, Chronic Cough, Coughing up blood, Shortness of breath at rest and Shortness of breath with exertion. Cardiovascular Not Present- Chest Pain, Difficulty Breathing Lying Down, Murmur, Palpitations, Racing/skipping heartbeats and Swelling. Gastrointestinal Not Present- Abdominal Pain, Bloody Stool, Constipation, Diarrhea, Difficulty Swallowing, Heartburn, Jaundice, Loss of appetitie, Nausea and Vomiting. Female Genitourinary Not Present- Blood in Urine, Discharge, Flank Pain, Incontinence, Painful Urination, Urgency, Urinary frequency, Urinary Retention, Urinating at Night and Weak urinary stream. Musculoskeletal Present- Joint Pain. Not Present- Back Pain, Joint Swelling, Morning Stiffness, Muscle Pain, Muscle Weakness and Spasms. Neurological Not Present- Blackout spells, Difficulty with balance, Dizziness, Paralysis, Tremor and Weakness. Psychiatric Not Present- Insomnia.  Vitals Weight: 135 lb Height: 64in Body Surface Area: 1.66 m Body Mass Index: 23.17 kg/m  Pulse: 88 (Regular)  Resp.: 12 (Unlabored)  BP: 132/62 (Sitting, Right Arm, Standard)   Physical Exam General Mental Status -Alert, cooperative and good historian. General Appearance-pleasant, Not in acute distress. Orientation-Oriented X3. Build & Nutrition-Well nourished and Well developed.  Head and Neck Head-normocephalic, atraumatic . Neck Global Assessment - supple, no bruit auscultated on the right, no bruit auscultated on the left.  Eye Pupil - Bilateral-Regular and Round. Motion - Bilateral-EOMI.  ENMT Note: bilateral hearing aids   Chest and Lung Exam Auscultation Breath sounds - clear at anterior chest wall and clear at posterior chest wall. Adventitious sounds - No Adventitious sounds.  Cardiovascular Auscultation Rhythm - Regular rate and rhythm.  Heart Sounds - S1 WNL and S2 WNL. Murmurs & Other Heart Sounds - Auscultation of the heart reveals - No Murmurs.  Abdomen Palpation/Percussion Tenderness - Abdomen is non-tender to palpation. Rigidity (guarding) - Abdomen is soft. Auscultation Auscultation of the abdomen reveals - Bowel sounds normal.  Female Genitourinary Note: Not done, not pertinent to present illness   Musculoskeletal Note: Very pleasant, well developed female, alert and oriented, in no apparent distress. Her left hip can be flexed to 120, rotated in 30, out 40, abduct to 40 without discomfort. Right hip flexion to 100, about 10 degrees of internal rotation, 20/30 degrees of external rotation, 30 degrees of abduction. She does have pain on range of motion in that right hip. Gait pattern is antalgic on the right.  RADIOGRAPHS AP pelvis, AP and lateral of the right hip taken today show bone on bone arthritis in that right hip. She has subchondral cystic formation.  Assessment & Plan Primary osteoarthritis of right hip (M16.11)  Note:Surgical Plans: Right Total Hip Replacement - Anterior Approach  Disposition: Home with family, In-Home VERA Therapy  PCP: Dr. Jonathon Jordan - Patient has been seen preoperatively and felt to be stable for surgery.  IV TXA  Anesthesia Issues: None  Patient was instructed on what medications to stop prior to surgery.  Signed electronically by Joelene Millin, III PA-C

## 2016-04-05 NOTE — Anesthesia Procedure Notes (Signed)
Spinal  Patient location during procedure: OR Start time: 04/05/2016 11:17 AM End time: 04/05/2016 11:25 AM Staffing Anesthesiologist: Kawana Hegel, Iona Beard Performed: anesthesiologist  Preanesthetic Checklist Completed: patient identified, site marked, surgical consent, pre-op evaluation, timeout performed, IV checked, risks and benefits discussed and monitors and equipment checked Spinal Block Patient position: sitting Prep: Betadine Patient monitoring: heart rate, continuous pulse ox and blood pressure Injection technique: single-shot Needle Needle type: Sprotte  Needle gauge: 24 G Needle length: 9 cm Additional Notes Expiration date of kit checked and confirmed. Patient tolerated procedure well, without complications.

## 2016-04-05 NOTE — Transfer of Care (Signed)
Immediate Anesthesia Transfer of Care Note  Patient: Marisa Holden  Procedure(s) Performed: Procedure(s): RIGHT TOTAL HIP ARTHROPLASTY ANTERIOR APPROACH (Right)  Patient Location: PACU  Anesthesia Type:MAC combined with regional for post-op pain  Level of Consciousness:  sedated, patient cooperative and responds to stimulation  Airway & Oxygen Therapy:Patient Spontanous Breathing and Patient connected to face mask oxgen  Post-op Assessment:  Report given to PACU RN and Post -op Vital signs reviewed and stable  Post vital signs:  Reviewed and stable  Last Vitals:  Vitals:   04/05/16 1035  BP: 122/64  Pulse: 78  Resp: 16  Temp: 02.7 C    Complications: No apparent anesthesia complications

## 2016-04-05 NOTE — Interval H&P Note (Signed)
History and Physical Interval Note:  04/05/2016 11:08 AM  Marisa Holden  has presented today for surgery, with the diagnosis of right hip osteoarthritis  The various methods of treatment have been discussed with the patient and family. After consideration of risks, benefits and other options for treatment, the patient has consented to  Procedure(s): RIGHT TOTAL HIP ARTHROPLASTY ANTERIOR APPROACH (Right) as a surgical intervention .  The patient's history has been reviewed, patient examined, no change in status, stable for surgery.  I have reviewed the patient's chart and labs.  Questions were answered to the patient's satisfaction.     Gearlean Alf

## 2016-04-06 LAB — CBC
HCT: 34.7 % — ABNORMAL LOW (ref 36.0–46.0)
HEMOGLOBIN: 11.7 g/dL — AB (ref 12.0–15.0)
MCH: 32 pg (ref 26.0–34.0)
MCHC: 33.7 g/dL (ref 30.0–36.0)
MCV: 94.8 fL (ref 78.0–100.0)
Platelets: 201 10*3/uL (ref 150–400)
RBC: 3.66 MIL/uL — AB (ref 3.87–5.11)
RDW: 12.6 % (ref 11.5–15.5)
WBC: 14.6 10*3/uL — ABNORMAL HIGH (ref 4.0–10.5)

## 2016-04-06 LAB — BASIC METABOLIC PANEL
ANION GAP: 6 (ref 5–15)
BUN: 12 mg/dL (ref 6–20)
CO2: 28 mmol/L (ref 22–32)
Calcium: 9 mg/dL (ref 8.9–10.3)
Chloride: 104 mmol/L (ref 101–111)
Creatinine, Ser: 0.55 mg/dL (ref 0.44–1.00)
GFR calc Af Amer: >60 mL/min (ref >60)
GFR calc non Af Amer: >60 mL/min (ref >60)
GLUCOSE: 131 mg/dL — AB (ref 65–99)
POTASSIUM: 4.8 mmol/L (ref 3.5–5.1)
SODIUM: 138 mmol/L (ref 135–145)

## 2016-04-06 MED ORDER — SODIUM CHLORIDE 0.9 % IV BOLUS (SEPSIS)
250.0000 mL | Freq: Once | INTRAVENOUS | Status: AC
  Administered 2016-04-06: 250 mL via INTRAVENOUS

## 2016-04-06 MED ORDER — METHOCARBAMOL 500 MG PO TABS: 500.0000 mg | ORAL_TABLET | Freq: Four times a day (QID) | ORAL | 0 refills | Status: DC | PRN

## 2016-04-06 MED ORDER — OXYCODONE HCL 5 MG PO TABS: 5.0000 mg | ORAL_TABLET | ORAL | 0 refills | Status: DC | PRN

## 2016-04-06 MED ORDER — RIVAROXABAN 10 MG PO TABS: 10.0000 mg | ORAL_TABLET | Freq: Every day | ORAL | 0 refills | Status: DC

## 2016-04-06 NOTE — Care Management Note (Signed)
Case Management Note  Patient Details  Name: MALYIA MORO MRN: [id number] Date of Birth: [birth year]  Subjective/Objective: 71 y.o. s/p R THA 04/05/2016, to be discharged home with [facility]. No DME needs.                     Action/Plan: Anticipate discharge home Friday. . No further CM needs but will be available should additional discharge needs arise.   Expected Discharge Date:  04/07/16               Expected Discharge Plan:  Home/Self Care  In-House Referral:  NA  Discharge planning Services  CM Consult  Post Acute Care Choice:  NA Choice offered to:  Patient  DME Arranged:  N/A DME Agency:  NA  HH Arranged:  NA HH Agency:  NA  Status of Service:  Completed, signed off  If discussed at [place] of Stay Meetings, dates discussed:    Additional Comments:  Delrae Sawyers, RN 04/06/2016, 9:06 AM

## 2016-04-06 NOTE — Evaluation (Signed)
Occupational Therapy Evaluation Patient Details Name: Marisa Holden MRN: [id number] DOB: [birth year] Today's Date: 04/06/2016    History of Present Illness S/P R direct anterior THA    Clinical Impression   Pt is a 71 y/o F who was admitted for the above procedure. All education provided with Pt verbalizing understanding. No other OT needs at this time.     Follow Up Recommendations  No OT follow up;Supervision/Assistance - 24 hour    Equipment Recommendations  None recommended by OT    Recommendations for Other Services       Precautions / Restrictions Precautions Precautions: Fall Restrictions Weight Bearing Restrictions: No Other Position/Activity Restrictions: WBAT      Mobility Bed Mobility               General bed mobility comments: OOB  Transfers Overall transfer level: Needs assistance Equipment used: Rolling walker (2 wheeled) Transfers: Sit to/from Stand Sit to Stand: Min guard         General transfer comment: Cues for UE/LE placement     Balance                                           ADL either performed or assessed with clinical judgement   ADL Overall ADL's : Needs assistance/impaired Eating/Feeding: Independent   Grooming: Supervision/safety;Oral care;Standing   Upper Body Bathing: Sitting;Set up   Lower Body Bathing: Minimal assistance;Sit to/from stand   Upper Body Dressing : Set up;Sitting   Lower Body Dressing: Moderate assistance;Sit to/from stand   Toilet Transfer: Min guard;Ambulation;RW Toilet Transfer Details (indicate cue type and reason): To chair Toileting- Clothing Manipulation and Hygiene: Min guard;Sit to/from stand         General ADL Comments: Educated on adaptive equipment (reacher and sock aid) and shower sequence. Pt verbalized understanding.     Vision         Perception     Praxis      Pertinent Vitals/Pain Pain Assessment: 0-10 Pain Score: 3  Pain Location: R hip Pain  Descriptors / Indicators: Sore Pain Intervention(s): Limited activity within patient's tolerance;Monitored during session;Premedicated before session;Ice applied;Repositioned     Hand Dominance     Extremity/Trunk Assessment Upper Extremity Assessment Upper Extremity Assessment: Overall WFL for tasks assessed           Communication Communication Communication: No difficulties   Cognition Arousal/Alertness: Awake/alert Behavior During Therapy: WFL for tasks assessed/performed Overall Cognitive Status: Within Functional Limits for tasks assessed                                     General Comments       Exercises     Shoulder Instructions      Home Living Family/patient expects to be discharged to:: Private residence Living Arrangements: Spouse/significant other                 Bathroom Shower/Tub: Occupational psychologist: Standard     Home Equipment: Bedside commode;Shower seat   Additional Comments: Reacher       Prior Functioning/Environment Level of Independence: Independent                 OT Problem List:        OT Treatment/Interventions:  OT Goals(Current goals can be found in the care plan section) Acute Rehab OT Goals Patient Stated Goal: Return to independence. OT Goal Formulation: All assessment and education complete, DC therapy  OT Frequency:     Barriers to D/C:            Co-evaluation              End of Session Equipment Utilized During Treatment: Rolling walker  Activity Tolerance: Patient tolerated treatment well Patient left: in chair;with call bell/phone within reach;with family/visitor present  OT Visit Diagnosis: Pain Pain - Right/Left: Right Pain - part of body: Hip                Time: 9937-1696 OT Time Calculation (min): 21 min Charges:  OT General Charges $OT Visit: 1 Procedure OT Evaluation $OT Eval Low Complexity: 1 Procedure G-Codes:     [place],  OTR/L [phone] 04/06/2016  Shan Valdes 04/06/2016, 2:39 PM

## 2016-04-06 NOTE — Progress Notes (Signed)
   Subjective: 1 Day Post-Op Procedure(s) (LRB): RIGHT TOTAL HIP ARTHROPLASTY ANTERIOR APPROACH (Right) Patient reports pain as mild.   Patient seen in rounds by Dr. Wynelle Link. Patient is well, but has had some minor complaints of pain in the hip, requiring pain medications We will start therapy today.  Plan is to go Home after hospital stay.  Objective: Vital signs in last 24 hours: Temp:  [97.3 F (36.3 C)-98.2 F (36.8 C)] 98.2 F (36.8 C) (03/29 0648) Pulse Rate:  [73-100] 81 (03/29 0648) Resp:  [12-16] 16 (03/29 0648) BP: (97-137)/(48-75) 118/56 (03/29 0648) SpO2:  [93 %-100 %] 97 % (03/29 0648)  Intake/Output from previous day:  Intake/Output Summary (Last 24 hours) at 04/06/16 0738 Last data filed at 04/06/16 0700  Gross per 24 hour  Intake          4263.75 ml  Output             3495 ml  Net           768.75 ml    Intake/Output this shift: No intake/output data recorded.  Labs:  Recent Labs  04/06/16 0433  HGB 11.7*    Recent Labs  04/06/16 0433  WBC 14.6*  RBC 3.66*  HCT 34.7*  PLT 201    Recent Labs  04/06/16 0433  NA 138  K 4.8  CL 104  CO2 28  BUN 12  CREATININE 0.55  GLUCOSE 131*  CALCIUM 9.0   No results for input(s): LABPT, INR in the last 72 hours.  EXAM General - Patient is Alert and Appropriate Extremity - Neurovascular intact Sensation intact distally Dressing - dressing C/D/I Motor Function - intact, moving foot and toes well on exam.  Hemovac pulled without difficulty.  Past Medical History:  Diagnosis Date  . Anxiety    due to surgery  . Arthritis   . Dysrhythmia    heart palpitaions  . Hypertension   . Pre-diabetes    diet controlled    Assessment/Plan: 1 Day Post-Op Procedure(s) (LRB): RIGHT TOTAL HIP ARTHROPLASTY ANTERIOR APPROACH (Right) Principal Problem:   OA (osteoarthritis) of hip  Estimated body mass index is 24.03 kg/m as calculated from the following:   Height as of 03/28/16: 5\' 4"  (1.626 m).  Weight as of 03/28/16: 63.5 kg (140 lb). Advance diet Up with therapy Plan for discharge tomorrow Discharge home - In-Home [facility] Therapy  DVT Prophylaxis - Xarelto Weight-Bearing as tolerated to right leg D/C O2 and Pulse OX and try on Room Air  HGB 11.7  Arlee Muslim, PA-C Orthopaedic Surgery 04/06/2016, 7:38 AM

## 2016-04-06 NOTE — Progress Notes (Signed)
OT Cancellation Note  Patient Details Name: Marisa Holden MRN: [id number] DOB: 17-Jun-1945   Cancelled Treatment:    Reason Eval/Treat Not Completed: Other (comment). Pt is feeling nauseous and dizzy. Will check back later.  Lalena Salas 04/06/2016, 9:46 AM  Lesle Chris, OTR/L [phone] 04/06/2016

## 2016-04-06 NOTE — Progress Notes (Signed)
qPhysical Therapy Treatment Patient Details Name: Marisa Holden MRN: [id number] DOB: March 20, 1945 Today's Date: 04/06/2016    History of Present Illness S/P R direct anterior THA     PT Comments    Pt continues motivated but limited this pm by marked increase in pain despite premed.  RN aware.   Follow Up Recommendations  Home health PT     Equipment Recommendations  None recommended by PT    Recommendations for Other Services OT consult     Precautions / Restrictions Precautions Precautions: Fall Restrictions Weight Bearing Restrictions: No Other Position/Activity Restrictions: WBAT    Mobility  Bed Mobility Overal bed mobility: Needs Assistance Bed Mobility: Sit to Supine     Supine to sit: Min assist Sit to supine: Min assist   General bed mobility comments: cues for sequence and use of L LE to self assist  Transfers Overall transfer level: Needs assistance Equipment used: Rolling walker (2 wheeled) Transfers: Sit to/from Stand Sit to Stand: Min assist;Min guard         General transfer comment: Cues for UE/LE placement   Ambulation/Gait Ambulation/Gait assistance: Min assist Ambulation Distance (Feet): 20 Feet Assistive device: Rolling walker (2 wheeled) Gait Pattern/deviations: Step-to pattern;Decreased step length - right;Decreased step length - left;Shuffle;Trunk flexed Gait velocity: decr Gait velocity interpretation: Below normal speed for age/gender General Gait Details: cues for sequence, posture and position from [employer]            Wheelchair Mobility    Modified Rankin (Stroke Patients Only)       Balance                                            Cognition Arousal/Alertness: Awake/alert Behavior During Therapy: WFL for tasks assessed/performed Overall Cognitive Status: Within Functional Limits for tasks assessed                                        Exercises Total Joint  Exercises Ankle Circles/Pumps: AROM;Both;15 reps;Supine Quad Sets: AROM;Both;10 reps;Supine Heel Slides: AAROM;Right;Supine;20 reps Hip ABduction/ADduction: AAROM;Right;15 reps;Supine    General Comments        Pertinent Vitals/Pain Pain Assessment: 0-10 Pain Score: 5  Pain Location: R hip Pain Descriptors / Indicators: Aching;Sore Pain Intervention(s): Limited activity within patient's tolerance;Monitored during session;Premedicated before session;Ice applied    Home Living Family/patient expects to be discharged to:: Private residence Living Arrangements: Spouse/significant other Available Help at Discharge: Family Type of Home: House Home Access: Level entry   Home Layout: Able to live on main level with bedroom/bathroom Home Equipment: Bedside commode;Shower seat;Walker - 2 wheels Additional Comments: Reacher     Prior Function Level of Independence: Independent          PT Goals (current goals can now be found in the care plan section) Acute Rehab PT Goals Patient Stated Goal: Return to independence. PT Goal Formulation: With patient Potential to Achieve Goals: Good Progress towards PT goals: Not progressing toward goals - comment (Marked increase in pain vs am session)    Frequency    7X/week      PT Plan Current plan remains appropriate    Co-evaluation             End of Session Equipment Utilized During Treatment:  Gait belt Activity Tolerance: Patient tolerated treatment well Patient left: in bed;with call bell/phone within reach;with family/visitor present Nurse Communication: Mobility status PT Visit Diagnosis: Difficulty in walking, not elsewhere classified (R26.2)     Time: 1761-6073 PT Time Calculation (min) (ACUTE ONLY): 25 min  Charges:  $Gait Training: 8-22 mins $Therapeutic Exercise: 8-22 mins                    G Codes:       Marisa Holden Apr 07, 2016, 4:43 PM

## 2016-04-06 NOTE — Evaluation (Signed)
Physical Therapy Evaluation Patient Details Name: Marisa Holden MRN: [id number] DOB: 04/07/1945 Today's Date: 04/06/2016   History of Present Illness  S/P R direct anterior THA   Clinical Impression  Pt s/p R THR and presents with decreased R LE strength/ROM and post op pain limiting functional mobility.  Pt should progress to dc home with assist of family and plans Virtual PT follow up.    Follow Up Recommendations Home health PT (Pt plans Virtual Therapy at home)    Equipment Recommendations  None recommended by PT    Recommendations for Other Services OT consult     Precautions / Restrictions Precautions Precautions: Fall Restrictions Weight Bearing Restrictions: No Other Position/Activity Restrictions: WBAT      Mobility  Bed Mobility Overal bed mobility: Needs Assistance Bed Mobility: Supine to Sit     Supine to sit: Min assist     General bed mobility comments: cues for sequence and use of L LE to self assist  Transfers Overall transfer level: Needs assistance Equipment used: Rolling walker (2 wheeled) Transfers: Sit to/from Stand Sit to Stand: Min assist;Min guard         General transfer comment: Cues for UE/LE placement   Ambulation/Gait Ambulation/Gait assistance: Min assist Ambulation Distance (Feet): 111 Feet Assistive device: Rolling walker (2 wheeled) Gait Pattern/deviations: Step-to pattern;Decreased step length - right;Decreased step length - left;Shuffle;Trunk flexed Gait velocity: decr Gait velocity interpretation: Below normal speed for age/gender General Gait Details: cues for sequence, posture and position from [employer]            Wheelchair Mobility    Modified Rankin (Stroke Patients Only)       Balance                                             Pertinent Vitals/Pain Pain Assessment: 0-10 Pain Score: 2  Pain Location: R hip Pain Descriptors / Indicators: Sore Pain Intervention(s): Limited  activity within patient's tolerance;Monitored during session;Premedicated before session;Ice applied    Home Living Family/patient expects to be discharged to:: Private residence Living Arrangements: Spouse/significant other Available Help at Discharge: Family Type of Home: House Home Access: Level entry     Home Layout: Able to live on main level with bedroom/bathroom Home Equipment: Bedside commode;Shower seat;Walker - 2 wheels Additional Comments: Reacher     Prior Function Level of Independence: Independent               Hand Dominance        Extremity/Trunk Assessment   Upper Extremity Assessment Upper Extremity Assessment: Overall WFL for tasks assessed    Lower Extremity Assessment Lower Extremity Assessment: RLE deficits/detail RLE Deficits / Details: Strength at hip 2+/5 with AAROM at hip to 80 flex and 20 abd    Cervical / Trunk Assessment Cervical / Trunk Assessment: Normal  Communication   Communication: No difficulties  Cognition Arousal/Alertness: Awake/alert Behavior During Therapy: WFL for tasks assessed/performed Overall Cognitive Status: Within Functional Limits for tasks assessed                                        General Comments      Exercises Total Joint Exercises Ankle Circles/Pumps: AROM;Both;15 reps;Supine Quad Sets: AROM;Both;10 reps;Supine Heel Slides: AAROM;Right;Supine;20 reps Hip ABduction/ADduction: AAROM;Right;15  reps;Supine   Assessment/Plan    PT Assessment Patient needs continued PT services  PT Problem List Decreased strength;Decreased range of motion;Decreased activity tolerance;Decreased mobility;Pain;Decreased knowledge of use of DME       PT Treatment Interventions DME instruction;Gait training;Stair training;Functional mobility training;Therapeutic activities;Therapeutic exercise;Patient/family education    PT Goals (Current goals can be found in the Care Plan section)  Acute Rehab PT  Goals Patient Stated Goal: Return to independence. PT Goal Formulation: With patient Potential to Achieve Goals: Good    Frequency 7X/week   Barriers to discharge        Co-evaluation               End of Session Equipment Utilized During Treatment: Gait belt Activity Tolerance: Patient tolerated treatment well Patient left: in chair;with call bell/phone within reach;with family/visitor present Nurse Communication: Mobility status PT Visit Diagnosis: Difficulty in walking, not elsewhere classified (R26.2)    Time: 8889-1694 PT Time Calculation (min) (ACUTE ONLY): 42 min   Charges:   PT Evaluation $PT Eval Low Complexity: 1 Procedure PT Treatments $Gait Training: 8-22 mins $Therapeutic Exercise: 8-22 mins   PT G Codes:          Sinclaire Artiga 04/06/2016, 4:38 PM

## 2016-04-06 NOTE — Discharge Instructions (Addendum)
° °Dr. Frank Aluisio °Total Joint Specialist °[facility] °[address] °[place], [place] [address] °[phone] ° °ANTERIOR APPROACH TOTAL HIP REPLACEMENT POSTOPERATIVE DIRECTIONS ° ° °Hip Rehabilitation, Guidelines Following Surgery  °The results of a hip operation are greatly improved after range of motion and muscle strengthening exercises. Follow all safety measures which are given to protect your hip. If any of these exercises cause increased pain or swelling in your joint, decrease the amount until you are comfortable again. Then slowly increase the exercises. Call your caregiver if you have problems or questions.  ° °HOME CARE INSTRUCTIONS  °Remove items at home which could result in a fall. This includes throw rugs or furniture in walking pathways.  °· ICE to the affected hip every three hours for 30 minutes at a time and then as needed for pain and swelling.  Continue to use ice on the hip for pain and swelling from surgery. You may notice swelling that will progress down to the foot and ankle.  This is normal after surgery.  Elevate the leg when you are not up walking on it.   °· Continue to use the breathing machine which will help keep your temperature down.  It is common for your temperature to cycle up and down following surgery, especially at night when you are not up moving around and exerting yourself.  The breathing machine keeps your lungs expanded and your temperature down. ° ° °DIET °You may resume your previous home diet once your are discharged from the hospital. ° °DRESSING / WOUND CARE / SHOWERING °You may shower 3 days after surgery, but keep the wounds dry during showering.  You may use an occlusive plastic wrap (Press'n Seal for example), NO SOAKING/SUBMERGING IN THE BATHTUB.  If the bandage gets wet, change with a clean dry gauze.  If the incision gets wet, pat the wound dry with a clean towel. °You may start showering once you are discharged home but do not  submerge the incision under water. Just pat the incision dry and apply a dry gauze dressing on daily. °Change the surgical dressing daily and reapply a dry dressing each time. ° °ACTIVITY °Walk with your walker as instructed. °Use walker as long as suggested by your caregivers. °Avoid periods of inactivity such as sitting longer than an hour when not asleep. This helps prevent blood clots.  °You may resume a sexual relationship in one month or when given the OK by your doctor.  °You may return to work once you are cleared by your doctor.  °Do not drive a car for 6 weeks or until released by you surgeon.  °Do not drive while taking narcotics. ° °WEIGHT BEARING °Weight bearing as tolerated with assist device (walker, cane, etc) as directed, use it as long as suggested by your surgeon or therapist, typically at least 4-6 weeks. ° °POSTOPERATIVE CONSTIPATION PROTOCOL °Constipation - defined medically as fewer than three stools per week and severe constipation as less than one stool per week. ° °One of the most common issues patients have following surgery is constipation.  Even if you have a regular bowel pattern at home, your normal regimen is likely to be disrupted due to multiple reasons following surgery.  Combination of anesthesia, postoperative narcotics, change in appetite and fluid intake all can affect your bowels.  In order to avoid complications following surgery, here are some recommendations in order to help you during your recovery period. ° °Colace (docusate) - Pick up an over-the-counter   form of Colace or another stool softener and take twice a day as long as you are requiring postoperative pain medications.  Take with a full glass of water daily.  If you experience loose stools or diarrhea, hold the colace until you stool forms back up.  If your symptoms do not get better within 1 week or if they get worse, check with your doctor. ° °Dulcolax (bisacodyl) - Pick up over-the-counter and take as directed  by the product packaging as needed to assist with the movement of your bowels.  Take with a full glass of water.  Use this product as needed if not relieved by Colace only.  ° °MiraLax (polyethylene glycol) - Pick up over-the-counter to have on hand.  MiraLax is a solution that will increase the amount of water in your bowels to assist with bowel movements.  Take as directed and can mix with a glass of water, juice, soda, coffee, or tea.  Take if you go more than two days without a movement. °Do not use MiraLax more than once per day. Call your doctor if you are still constipated or irregular after using this medication for 7 days in a row. ° °If you continue to have problems with postoperative constipation, please contact the office for further assistance and recommendations.  If you experience "the worst abdominal pain ever" or develop nausea or vomiting, please contact the office immediatly for further recommendations for treatment. ° °ITCHING ° If you experience itching with your medications, try taking only a single pain pill, or even half a pain pill at a time.  You can also use Benadryl over the counter for itching or also to help with sleep.  ° °TED HOSE STOCKINGS °Wear the elastic stockings on both legs for three weeks following surgery during the day but you may remove then at night for sleeping. ° °MEDICATIONS °See your medication summary on the “After Visit Summary” that the nursing staff will review with you prior to discharge.  You may have some home medications which will be placed on hold until you complete the course of blood thinner medication.  It is important for you to complete the blood thinner medication as prescribed by your surgeon.  Continue your approved medications as instructed at time of discharge. ° °PRECAUTIONS °If you experience chest pain or shortness of breath - call 911 immediately for transfer to the hospital emergency department.  °If you develop a fever greater that 101 F,  purulent drainage from wound, increased redness or drainage from wound, foul odor from the wound/dressing, or calf pain - CONTACT YOUR SURGEON.   °                                                °FOLLOW-UP APPOINTMENTS °Make sure you keep all of your appointments after your operation with your surgeon and caregivers. You should call the office at the above phone number and make an appointment for approximately two weeks after the date of your surgery or on the date instructed by your surgeon outlined in the "After Visit Summary". ° °RANGE OF MOTION AND STRENGTHENING EXERCISES  °These exercises are designed to help you keep full movement of your hip joint. Follow your caregiver's or physical therapist's instructions. Perform all exercises about fifteen times, three times per day or as directed. Exercise both hips, even if you   have had only one joint replacement. These exercises can be done on a training (exercise) mat, on the floor, on a table or on a bed. Use whatever works the best and is most comfortable for you. Use music or television while you are exercising so that the exercises are a pleasant break in your day. This will make your life better with the exercises acting as a break in routine you can look forward to.  Lying on your back, slowly slide your foot toward your buttocks, raising your knee up off the floor. Then slowly slide your foot back down until your leg is straight again.  Lying on your back spread your legs as far apart as you can without causing discomfort.  Lying on your side, raise your upper leg and foot straight up from the floor as far as is comfortable. Slowly lower the leg and repeat.  Lying on your back, tighten up the muscle in the front of your thigh (quadriceps muscles). You can do this by keeping your leg straight and trying to raise your heel off the floor. This helps strengthen the largest muscle supporting your knee.  Lying on your back, tighten up the muscles of your  buttocks both with the legs straight and with the knee bent at a comfortable angle while keeping your heel on the floor.   IF YOU ARE TRANSFERRED TO A SKILLED REHAB FACILITY If the patient is transferred to a skilled rehab facility following release from the hospital, a list of the current medications will be sent to the facility for the patient to continue.  When discharged from the skilled rehab facility, please have the facility set up the patient's [place] prior to being released. Also, the skilled facility will be responsible for providing the patient with their medications at time of release from the facility to include their pain medication, the muscle relaxants, and their blood thinner medication. If the patient is still at the rehab facility at time of the two week follow up appointment, the skilled rehab facility will also need to assist the patient in arranging follow up appointment in our office and any transportation needs.  MAKE SURE YOU:  Understand these instructions.  Get help right away if you are not doing well or get worse.    Pick up stool softner and laxative for home use following surgery while on pain medications. Do not submerge incision under water. Please use good hand washing techniques while changing dressing each day. May shower starting three days after surgery. Please use a clean towel to pat the incision dry following showers. Continue to use ice for pain and swelling after surgery. Do not use any lotions or creams on the incision until instructed by your surgeon.  Take Xarelto for two and a half more weeks following discharge from the hospital, then discontinue Xarelto. Once the patient has completed the blood thinner regimen, then take a Baby 81 mg Aspirin daily for three more weeks.   Information on my medicine - XARELTO (Rivaroxaban)  This medication education was reviewed with me or my healthcare representative as part of my  discharge preparation.  The pharmacist that spoke with me during my hospital stay was:  Tommie Raymond, Student-PharmD  Why was Xarelto prescribed for you? Xarelto was prescribed for you to reduce the risk of blood clots forming after orthopedic surgery. The medical term for these abnormal blood clots is venous thromboembolism (VTE).  What do you need to know about  xarelto ? Take your Xarelto ONCE DAILY at the same time every day. You may take it either with or without food.  If you have difficulty swallowing the tablet whole, you may crush it and mix in applesauce just prior to taking your dose.  Take Xarelto exactly as prescribed by your doctor and DO NOT stop taking Xarelto without talking to the doctor who prescribed the medication.  Stopping without other VTE prevention medication to take the place of Xarelto may increase your risk of developing a clot.  After discharge, you should have regular check-up appointments with your healthcare provider that is prescribing your Xarelto.    What do you do if you miss a dose? If you miss a dose, take it as soon as you remember on the same day then continue your regularly scheduled once daily regimen the next day. Do not take two doses of Xarelto on the same day.   Important Safety Information A possible side effect of Xarelto is bleeding. You should call your healthcare provider right away if you experience any of the following: ? Bleeding from an injury or your nose that does not stop. ? Unusual colored urine (red or dark brown) or unusual colored stools (red or black). ? Unusual bruising for unknown reasons. ? A serious fall or if you hit your head (even if there is no bleeding).  Some medicines may interact with Xarelto and might increase your risk of bleeding while on Xarelto. To help avoid this, consult your healthcare provider or pharmacist prior to using any new prescription or non-prescription medications, including herbals,  vitamins, non-steroidal anti-inflammatory drugs (NSAIDs) and supplements.  This website has more information on Xarelto: [email or web address]

## 2016-04-07 LAB — BASIC METABOLIC PANEL
Anion gap: 6 (ref 5–15)
BUN: 11 mg/dL (ref 6–20)
CO2: 28 mmol/L (ref 22–32)
Calcium: 8.7 mg/dL — ABNORMAL LOW (ref 8.9–10.3)
Chloride: 105 mmol/L (ref 101–111)
Creatinine, Ser: 0.6 mg/dL (ref 0.44–1.00)
GFR calc Af Amer: >60 mL/min (ref >60)
GFR calc non Af Amer: >60 mL/min (ref >60)
GLUCOSE: 137 mg/dL — AB (ref 65–99)
POTASSIUM: 3.9 mmol/L (ref 3.5–5.1)
Sodium: 139 mmol/L (ref 135–145)

## 2016-04-07 LAB — CBC
HEMATOCRIT: 32.5 % — AB (ref 36.0–46.0)
Hemoglobin: 11.2 g/dL — ABNORMAL LOW (ref 12.0–15.0)
MCH: 32 pg (ref 26.0–34.0)
MCHC: 34.5 g/dL (ref 30.0–36.0)
MCV: 92.9 fL (ref 78.0–100.0)
Platelets: 162 10*3/uL (ref 150–400)
RBC: 3.5 MIL/uL — AB (ref 3.87–5.11)
RDW: 12.9 % (ref 11.5–15.5)
WBC: 11.3 10*3/uL — AB (ref 4.0–10.5)

## 2016-04-07 NOTE — Progress Notes (Signed)
qPhysical Therapy Treatment Patient Details Name: Marisa Holden MRN: [id number] DOB: [birth year] Today's Date: 04/07/2016    History of Present Illness S/P R direct anterior THA     PT Comments    Pt cooperative but ltd this am by fatigue/pain.   Follow Up Recommendations  Home health PT     Equipment Recommendations  None recommended by PT    Recommendations for Other Services OT consult     Precautions / Restrictions Precautions Precautions: Fall Restrictions Weight Bearing Restrictions: No Other Position/Activity Restrictions: WBAT    Mobility  Bed Mobility Overal bed mobility: Needs Assistance Bed Mobility: Supine to Sit;Sit to Supine     Supine to sit: Supervision Sit to supine: Supervision   General bed mobility comments: Pt self assisting with leg lifter - finds leg lifter very benificial  Transfers Overall transfer level: Needs assistance Equipment used: Rolling walker (2 wheeled) Transfers: Sit to/from Stand Sit to Stand: Supervision         General transfer comment: Cues for UE/LE placement   Ambulation/Gait             General Gait Details: deferred at pt request 2* fatigue and increased pain   Stairs            Wheelchair Mobility    Modified Rankin (Stroke Patients Only)       Balance                                            Cognition Arousal/Alertness: Awake/alert Behavior During Therapy: WFL for tasks assessed/performed Overall Cognitive Status: Within Functional Limits for tasks assessed                                        Exercises Total Joint Exercises Ankle Circles/Pumps: AROM;Both;15 reps;Supine Quad Sets: AROM;Both;10 reps;Supine Heel Slides: AAROM;Right;Supine;20 reps Hip ABduction/ADduction: AAROM;Right;15 reps;Supine    General Comments        Pertinent Vitals/Pain Pain Assessment: 0-10 Pain Score: 5  Pain Location: R hip Pain Descriptors / Indicators:  Aching;Sore Pain Intervention(s): Limited activity within patient's tolerance;Monitored during session;Premedicated before session;Ice applied    Home Living                      Prior Function            PT Goals (current goals can now be found in the care plan section) Acute Rehab PT Goals Patient Stated Goal: Return to independence. PT Goal Formulation: With patient Potential to Achieve Goals: Good Progress towards PT goals: Progressing toward goals    Frequency    7X/week      PT Plan Current plan remains appropriate    Co-evaluation             End of Session Equipment Utilized During Treatment: Gait belt Activity Tolerance: Patient tolerated treatment well Patient left: in bed;with call bell/phone within reach;with family/visitor present Nurse Communication: Mobility status PT Visit Diagnosis: Difficulty in walking, not elsewhere classified (R26.2)     Time: 9532-0233 PT Time Calculation (min) (ACUTE ONLY): 32 min  Charges:  $Therapeutic Exercise: 8-22 mins $Therapeutic Activity: 8-22 mins                    G Codes:  Marisa Holden 04/07/2016, 12:25 PM

## 2016-04-07 NOTE — Progress Notes (Signed)
   Subjective: 2 Days Post-Op Procedure(s) (LRB): RIGHT TOTAL HIP ARTHROPLASTY ANTERIOR APPROACH (Right)  Pt c/o moderate pain mainly trying to get out of bed She has not pain with ambulation Ready to d/c home today after therapy Denies any new symptoms or issues Patient reports pain as moderate.  Objective:   VITALS:   Vitals:   04/06/16 2154 04/07/16 0553  BP: (!) 122/55 111/62  Pulse: 94 93  Resp: 16 16  Temp: 98.9 F (37.2 C) 99.1 F (37.3 C)    Right hip incision healing well No drainage or erythema nv intact distally Good rom  LABS  Recent Labs  04/06/16 0433 04/07/16 0428  HGB 11.7* 11.2*  HCT 34.7* 32.5*  WBC 14.6* 11.3*  PLT 201 162     Recent Labs  04/06/16 0433 04/07/16 0428  NA 138 139  K 4.8 3.9  BUN 12 11  CREATININE 0.55 0.60  GLUCOSE 131* 137*     Assessment/Plan: 2 Days Post-Op Procedure(s) (LRB): RIGHT TOTAL HIP ARTHROPLASTY ANTERIOR APPROACH (Right) D/c home today f/u in 2 weeks Pain management Continue PT/OT at home    [place], PA-C  04/07/2016, 6:46 AM

## 2016-04-07 NOTE — Discharge Summary (Signed)
Physician Discharge Summary   Patient ID: Marisa Holden MRN: [id number] DOB/AGE: 1945/12/27 71 y.o.  Admit date: 04/05/2016 Discharge date: 04/07/2016  Admission Diagnoses:  Principal Problem:   OA (osteoarthritis) of hip   Discharge Diagnoses:  Same   Surgeries: Procedure(s): RIGHT TOTAL HIP ARTHROPLASTY ANTERIOR APPROACH on 04/05/2016   Consultants: PT/OT  Discharged Condition: Stable  Hospital Course: Marisa Holden is an 71 y.o. female who was admitted 04/05/2016 with a chief complaint of right hip pain, and found to have a diagnosis of OA (osteoarthritis) of hip.  They were brought to the operating room on 04/05/2016 and underwent the above named procedures.    The patient had an uncomplicated hospital course and was stable for discharge.  Recent vital signs:  Vitals:   04/06/16 2154 04/07/16 0553  BP: (!) 122/55 111/62  Pulse: 94 93  Resp: 16 16  Temp: 98.9 F (37.2 C) 99.1 F (37.3 C)    Recent laboratory studies:  Results for orders placed or performed during the hospital encounter of 04/05/16  CBC  Result Value Ref Range   WBC 14.6 (H) 4.0 - 10.5 K/uL   RBC 3.66 (L) 3.87 - 5.11 MIL/uL   Hemoglobin 11.7 (L) 12.0 - 15.0 g/dL   HCT 34.7 (L) 36.0 - 46.0 %   MCV 94.8 78.0 - 100.0 fL   MCH 32.0 26.0 - 34.0 pg   MCHC 33.7 30.0 - 36.0 g/dL   RDW 12.6 11.5 - 15.5 %   Platelets 201 150 - 400 K/uL  Basic metabolic panel  Result Value Ref Range   Sodium 138 135 - 145 mmol/L   Potassium 4.8 3.5 - 5.1 mmol/L   Chloride 104 101 - 111 mmol/L   CO2 28 22 - 32 mmol/L   Glucose, Bld 131 (H) 65 - 99 mg/dL   BUN 12 6 - 20 mg/dL   Creatinine, Ser 0.55 0.44 - 1.00 mg/dL   Calcium 9.0 8.9 - 10.3 mg/dL   GFR calc non Af Amer >60 >60 mL/min   GFR calc Af Amer >60 >60 mL/min   Anion gap 6 5 - 15  CBC  Result Value Ref Range   WBC 11.3 (H) 4.0 - 10.5 K/uL   RBC 3.50 (L) 3.87 - 5.11 MIL/uL   Hemoglobin 11.2 (L) 12.0 - 15.0 g/dL   HCT 32.5 (L) 36.0 - 46.0 %   MCV 92.9 78.0  - 100.0 fL   MCH 32.0 26.0 - 34.0 pg   MCHC 34.5 30.0 - 36.0 g/dL   RDW 12.9 11.5 - 15.5 %   Platelets 162 150 - 400 K/uL  Basic metabolic panel  Result Value Ref Range   Sodium 139 135 - 145 mmol/L   Potassium 3.9 3.5 - 5.1 mmol/L   Chloride 105 101 - 111 mmol/L   CO2 28 22 - 32 mmol/L   Glucose, Bld 137 (H) 65 - 99 mg/dL   BUN 11 6 - 20 mg/dL   Creatinine, Ser 0.60 0.44 - 1.00 mg/dL   Calcium 8.7 (L) 8.9 - 10.3 mg/dL   GFR calc non Af Amer >60 >60 mL/min   GFR calc Af Amer >60 >60 mL/min   Anion gap 6 5 - 15    Discharge Medications:   Allergies as of 04/07/2016   No Known Allergies     Medication List    TAKE these medications   ALPRAZolam 0.5 MG tablet Commonly known as:  XANAX Take 0.5 mg by mouth 2 (two)  times daily as needed for anxiety.   latanoprost 0.005 % ophthalmic solution Commonly known as:  XALATAN Place 1 drop into both eyes at bedtime.   losartan-hydrochlorothiazide 50-12.5 MG tablet Commonly known as:  HYZAAR Take 1 tablet by mouth daily.   methocarbamol 500 MG tablet Commonly known as:  ROBAXIN Take 1 tablet (500 mg total) by mouth every 6 (six) hours as needed for muscle spasms.   oxyCODONE 5 MG immediate release tablet Commonly known as:  Oxy IR/ROXICODONE Take 1-2 tablets (5-10 mg total) by mouth every 4 (four) hours as needed for moderate pain or severe pain.   rivaroxaban 10 MG Tabs tablet Commonly known as:  XARELTO Take 1 tablet (10 mg total) by mouth daily with breakfast. Take Xarelto for two and a half more weeks following discharge from the hospital, then discontinue Xarelto. Once the patient has completed the blood thinner regimen, then take a Baby 81 mg Aspirin daily for three more weeks.       Diagnostic Studies: Dg Pelvis Portable  Result Date: 04/05/2016 CLINICAL DATA:  Post right hip arthroplasty EXAM: PORTABLE PELVIS 1-2 VIEWS COMPARISON:  None. FINDINGS: Single view of the pelvis submitted. There is right hip prosthesis with  anatomic alignment. Postsurgical drain in place. Small amount of periarticular soft tissue air. IMPRESSION: Right hip prosthesis with anatomic alignment. Postsurgical changes are noted. Please see the operative report. Electronically Signed   By: Lahoma Crocker M.D.   On: 04/05/2016 14:17   Dg C-arm 1-60 Min-no Report  Result Date: 04/05/2016 Fluoroscopy was utilized by the requesting physician.  No radiographic interpretation.    Disposition: 01-Home or Self Care  Discharge Instructions    Call MD / Call 911    Complete by:  As directed    If you experience chest pain or shortness of breath, CALL 911 and be transported to the hospital emergency room.  If you develope a fever above 101 F, pus (white drainage) or increased drainage or redness at the wound, or calf pain, call your surgeon's office.   Call MD / Call 911    Complete by:  As directed    If you experience chest pain or shortness of breath, CALL 911 and be transported to the hospital emergency room.  If you develope a fever above 101 F, pus (white drainage) or increased drainage or redness at the wound, or calf pain, call your surgeon's office.   Change dressing    Complete by:  As directed    You may change your dressing dressing daily with sterile 4 x 4 inch gauze dressing and paper tape.  Do not submerge the incision under water.   Constipation Prevention    Complete by:  As directed    Drink plenty of fluids.  Prune juice may be helpful.  You may use a stool softener, such as Colace (over the counter) 100 mg twice a day.  Use MiraLax (over the counter) for constipation as needed.   Constipation Prevention    Complete by:  As directed    Drink plenty of fluids.  Prune juice may be helpful.  You may use a stool softener, such as Colace (over the counter) 100 mg twice a day.  Use MiraLax (over the counter) for constipation as needed.   Diet - low sodium heart healthy    Complete by:  As directed    Diet - low sodium heart healthy     Complete by:  As directed    Discharge  instructions    Complete by:  As directed    Pick up stool softner and laxative for home use following surgery while on pain medications. Do not submerge incision under water. Please use good hand washing techniques while changing dressing each day. May shower starting three days after surgery. Please use a clean towel to pat the incision dry following showers. Continue to use ice for pain and swelling after surgery. Do not use any lotions or creams on the incision until instructed by your surgeon.  Wear both TED hose on both legs during the day every day for three weeks, but may have off at night at home.  Postoperative Constipation Protocol  Constipation - defined medically as fewer than three stools per week and severe constipation as less than one stool per week.  One of the most common issues patients have following surgery is constipation.  Even if you have a regular bowel pattern at home, your normal regimen is likely to be disrupted due to multiple reasons following surgery.  Combination of anesthesia, postoperative narcotics, change in appetite and fluid intake all can affect your bowels.  In order to avoid complications following surgery, here are some recommendations in order to help you during your recovery period.  Colace (docusate) - Pick up an over-the-counter form of Colace or another stool softener and take twice a day as long as you are requiring postoperative pain medications.  Take with a full glass of water daily.  If you experience loose stools or diarrhea, hold the colace until you stool forms back up.  If your symptoms do not get better within 1 week or if they get worse, check with your doctor.  Dulcolax (bisacodyl) - Pick up over-the-counter and take as directed by the product packaging as needed to assist with the movement of your bowels.  Take with a full glass of water.  Use this product as needed if not relieved by Colace  only.   MiraLax (polyethylene glycol) - Pick up over-the-counter to have on hand.  MiraLax is a solution that will increase the amount of water in your bowels to assist with bowel movements.  Take as directed and can mix with a glass of water, juice, soda, coffee, or tea.  Take if you go more than two days without a movement. Do not use MiraLax more than once per day. Call your doctor if you are still constipated or irregular after using this medication for 7 days in a row.  If you continue to have problems with postoperative constipation, please contact the office for further assistance and recommendations.  If you experience "the worst abdominal pain ever" or develop nausea or vomiting, please contact the office immediatly for further recommendations for treatment.   Take Xarelto for two and a half more weeks, then discontinue Xarelto. Once the patient has completed the blood thinner regimen, then take a Baby 81 mg Aspirin daily for three more weeks.   Do not sit on low chairs, stoools or toilet seats, as it may be difficult to get up from low surfaces    Complete by:  As directed    Driving restrictions    Complete by:  As directed    No driving until released by the physician.   Increase activity slowly as tolerated    Complete by:  As directed    Increase activity slowly as tolerated    Complete by:  As directed    Lifting restrictions    Complete by:  As directed    No lifting until released by the physician.   Patient may shower    Complete by:  As directed    You may shower without a dressing once there is no drainage.  Do not wash over the wound.  If drainage remains, do not shower until drainage stops.   TED hose    Complete by:  As directed    Use stockings (TED hose) for 3 weeks on both leg(s).  You may remove them at night for sleeping.   Weight bearing as tolerated    Complete by:  As directed    Laterality:  right   Extremity:  Lower      Follow-up Information     Gearlean Alf, MD. Schedule an appointment as soon as possible for a visit on 04/18/2016.   Specialty:  Orthopedic Surgery Contact information: [address] [place] [address] [phone]            Signed: Ventura Bruns 04/07/2016, 6:48 AM

## 2016-04-13 DIAGNOSIS — M25551 Pain in right hip: Secondary | ICD-10-CM | POA: Diagnosis not present

## 2016-04-18 DIAGNOSIS — Z471 Aftercare following joint replacement surgery: Secondary | ICD-10-CM | POA: Diagnosis not present

## 2016-04-18 DIAGNOSIS — Z96641 Presence of right artificial hip joint: Secondary | ICD-10-CM | POA: Diagnosis not present

## 2016-05-08 DIAGNOSIS — Z0000 Encounter for general adult medical examination without abnormal findings: Secondary | ICD-10-CM | POA: Diagnosis not present

## 2016-05-09 DIAGNOSIS — Z96641 Presence of right artificial hip joint: Secondary | ICD-10-CM | POA: Diagnosis not present

## 2016-05-09 DIAGNOSIS — Z471 Aftercare following joint replacement surgery: Secondary | ICD-10-CM | POA: Diagnosis not present

## 2016-06-12 DIAGNOSIS — H401122 Primary open-angle glaucoma, left eye, moderate stage: Secondary | ICD-10-CM | POA: Diagnosis not present

## 2016-06-13 DIAGNOSIS — Z96641 Presence of right artificial hip joint: Secondary | ICD-10-CM | POA: Diagnosis not present

## 2016-06-13 DIAGNOSIS — Z471 Aftercare following joint replacement surgery: Secondary | ICD-10-CM | POA: Diagnosis not present

## 2016-06-22 ENCOUNTER — Encounter: Payer: Self-pay | Admitting: Obstetrics & Gynecology

## 2016-06-27 ENCOUNTER — Encounter: Payer: Self-pay | Admitting: Obstetrics & Gynecology

## 2016-06-27 ENCOUNTER — Ambulatory Visit (INDEPENDENT_AMBULATORY_CARE_PROVIDER_SITE_OTHER): Payer: Medicare Other | Admitting: Obstetrics & Gynecology

## 2016-06-27 VITALS — BP 128/82 | Ht 63.0 in | Wt 138.0 lb

## 2016-06-27 DIAGNOSIS — Z01411 Encounter for gynecological examination (general) (routine) with abnormal findings: Secondary | ICD-10-CM

## 2016-06-27 DIAGNOSIS — Z780 Asymptomatic menopausal state: Secondary | ICD-10-CM

## 2016-06-27 DIAGNOSIS — Z124 Encounter for screening for malignant neoplasm of cervix: Secondary | ICD-10-CM | POA: Diagnosis not present

## 2016-06-27 DIAGNOSIS — R6882 Decreased libido: Secondary | ICD-10-CM

## 2016-06-27 NOTE — Patient Instructions (Signed)
1. Encounter for gynecological examination with abnormal finding Normal gyn exam except for mild menopausal Atrophic Vaginitis.  Pap reflex done.  Breast exam normal.  Will schedule Screening Mammo at [facility].    2. Menopause present No HRT.  No PMB.  Atrophic Vaginitis, but no dyspareunia with [place].  Will schedule BD here.  Continue Vit D supplements/Ca++ in nutrition and wt bearing physical activity.  3. Low libido Low libido.  Recommend against Testosterone treatment at this point in her life because Risks outweigh the Benefits.  Recommend working on [employer] and discussing with husband what would be pleasing to her.  Declined referral.   Marisa Holden, it was a pleasure to see you today!  I will inform you of your results as soon as available.

## 2016-06-27 NOTE — Progress Notes (Signed)
Marisa Holden February 14, 1945 [id number]   History:    71 y.o.  G0  Married.  RP:  Established patient presenting for annual gyn exam  HPI:   Menopause.  No HRT.  No PMB.  No pelvic pain.  Mictions normal, no SUI.  Husband 58 yo interested in sex on Viagra.  Patient has low libido.  No pain with IC.  Using Phillips.  Breasts wnl.  BMs wnl.  Rt hip replacement in 03/2016, very good recovery.  Past medical history,surgical history, family history and social history were all reviewed and documented in the EPIC chart.  Gynecologic History No LMP recorded. Patient is postmenopausal. Contraception: post menopausal status Last Pap: 2015. Results were: normal Last mammogram: 2015. Results were: normal [place] 2016 BD 2011  Obstetric History OB History  Gravida Para Term Preterm AB Living  0 0 0 0 0 0  SAB TAB Ectopic Multiple Live Births  0 0 0 0 0         ROS: A ROS was performed and pertinent positives and negatives are included in the history.  GENERAL: No fevers or chills. HEENT: No change in vision, no earache, sore throat or sinus congestion. NECK: No pain or stiffness. CARDIOVASCULAR: No chest pain or pressure. No palpitations. PULMONARY: No shortness of breath, cough or wheeze. GASTROINTESTINAL: No abdominal pain, nausea, vomiting or diarrhea, melena or bright red blood per rectum. GENITOURINARY: No urinary frequency, urgency, hesitancy or dysuria. MUSCULOSKELETAL: No joint or muscle pain, no back pain, no recent trauma. DERMATOLOGIC: No rash, no itching, no lesions. ENDOCRINE: No polyuria, polydipsia, no heat or cold intolerance. No recent change in weight. HEMATOLOGICAL: No anemia or easy bruising or bleeding. NEUROLOGIC: No headache, seizures, numbness, tingling or weakness. PSYCHIATRIC: No depression, no loss of interest in normal activity or change in sleep pattern.     Exam:   BP 128/82   Ht 5\' 3"  (1.6 m)   Wt 138 lb (62.6 kg)   BMI 24.45 kg/m   Body mass index is 24.45  kg/m.  General appearance : Well developed well nourished female. No acute distress HEENT: Eyes: no retinal hemorrhage or exudates,  Neck supple, trachea midline, no carotid bruits, no thyroidmegaly Lungs: Clear to auscultation, no rhonchi or wheezes, or rib retractions  Heart: Regular rate and rhythm, no murmurs or gallops Breast:Examined in sitting and supine position were symmetrical in appearance, no palpable masses or tenderness,  no skin retraction, no nipple inversion, no nipple discharge, no skin discoloration, no axillary or supraclavicular lymphadenopathy Abdomen: no palpable masses or tenderness, no rebound or guarding Extremities: no edema or skin discoloration or tenderness  Pelvic:  Bartholin, Urethra, Skene Glands: Within normal limits             Vagina: No gross lesions or discharge  Cervix: No gross lesions or discharge.  Pap/HPV done.  Uterus  AV, normal size, shape and consistency, non-tender and mobile  Adnexa  Without masses or tenderness  Anus and perineum  normal   Assessment/Plan:  71 y.o. female for annual exam   1. Encounter for gynecological examination with abnormal finding Normal gyn exam except for mild menopausal Atrophic Vaginitis.  Pap reflex done.  Breast exam normal.  Will schedule Screening Mammo at [facility].    2. Menopause present No HRT.  No PMB.  Atrophic Vaginitis, but no dyspareunia with [place].  Will schedule BD here.  Continue Vit D supplements/Ca++ in nutrition and wt bearing physical activity.  3.  Low libido Low libido.  Recommend against Testosterone treatment at this point in her life because Risks outweigh the Benefits.  Recommend working on [employer] and discussing with husband what would be pleasing to her.  Declined referral.  Counseling on above issues >50% x 15 minutes.  [name] MD, 2:12 PM 06/27/2016

## 2016-06-30 LAB — PAP IG W/ RFLX HPV ASCU

## 2016-07-13 DIAGNOSIS — H401122 Primary open-angle glaucoma, left eye, moderate stage: Secondary | ICD-10-CM | POA: Diagnosis not present

## 2016-08-04 ENCOUNTER — Other Ambulatory Visit: Payer: Self-pay | Admitting: Obstetrics & Gynecology

## 2016-08-04 DIAGNOSIS — Z1231 Encounter for screening mammogram for malignant neoplasm of breast: Secondary | ICD-10-CM

## 2016-08-10 DIAGNOSIS — H401122 Primary open-angle glaucoma, left eye, moderate stage: Secondary | ICD-10-CM | POA: Diagnosis not present

## 2016-08-24 DIAGNOSIS — H401122 Primary open-angle glaucoma, left eye, moderate stage: Secondary | ICD-10-CM | POA: Diagnosis not present

## 2016-08-31 DIAGNOSIS — M25571 Pain in right ankle and joints of right foot: Secondary | ICD-10-CM | POA: Diagnosis not present

## 2016-09-08 DIAGNOSIS — H401122 Primary open-angle glaucoma, left eye, moderate stage: Secondary | ICD-10-CM | POA: Diagnosis not present

## 2016-09-20 ENCOUNTER — Ambulatory Visit: Payer: Medicare Other

## 2016-10-04 DIAGNOSIS — Z9889 Other specified postprocedural states: Secondary | ICD-10-CM | POA: Diagnosis not present

## 2016-10-04 DIAGNOSIS — H4052X3 Glaucoma secondary to other eye disorders, left eye, severe stage: Secondary | ICD-10-CM | POA: Diagnosis not present

## 2016-10-04 DIAGNOSIS — H401111 Primary open-angle glaucoma, right eye, mild stage: Secondary | ICD-10-CM | POA: Diagnosis not present

## 2016-10-16 DIAGNOSIS — Z23 Encounter for immunization: Secondary | ICD-10-CM | POA: Diagnosis not present

## 2016-11-27 ENCOUNTER — Ambulatory Visit: Payer: Medicare Other

## 2016-12-08 DIAGNOSIS — H401122 Primary open-angle glaucoma, left eye, moderate stage: Secondary | ICD-10-CM | POA: Diagnosis not present

## 2016-12-14 DIAGNOSIS — M7061 Trochanteric bursitis, right hip: Secondary | ICD-10-CM | POA: Diagnosis not present

## 2016-12-14 DIAGNOSIS — M7631 Iliotibial band syndrome, right leg: Secondary | ICD-10-CM | POA: Diagnosis not present

## 2016-12-26 ENCOUNTER — Ambulatory Visit
Admission: RE | Admit: 2016-12-26 | Discharge: 2016-12-26 | Disposition: A | Discharge status: Home / Self Care | Payer: Medicare Other | Source: Ambulatory Visit | Attending: Obstetrics & Gynecology | Admitting: Obstetrics & Gynecology

## 2016-12-26 DIAGNOSIS — Z1231 Encounter for screening mammogram for malignant neoplasm of breast: Secondary | ICD-10-CM

## 2016-12-28 DIAGNOSIS — M7061 Trochanteric bursitis, right hip: Secondary | ICD-10-CM | POA: Diagnosis not present

## 2017-01-15 DIAGNOSIS — M7061 Trochanteric bursitis, right hip: Secondary | ICD-10-CM | POA: Diagnosis not present

## 2017-01-25 DIAGNOSIS — Z96649 Presence of unspecified artificial hip joint: Secondary | ICD-10-CM | POA: Diagnosis not present

## 2017-01-25 DIAGNOSIS — M1611 Unilateral primary osteoarthritis, right hip: Secondary | ICD-10-CM | POA: Diagnosis not present

## 2017-02-21 DIAGNOSIS — F4322 Adjustment disorder with anxiety: Secondary | ICD-10-CM | POA: Diagnosis not present

## 2017-03-01 DIAGNOSIS — F4322 Adjustment disorder with anxiety: Secondary | ICD-10-CM | POA: Diagnosis not present

## 2017-03-08 DIAGNOSIS — F4322 Adjustment disorder with anxiety: Secondary | ICD-10-CM | POA: Diagnosis not present

## 2017-03-20 DIAGNOSIS — F4322 Adjustment disorder with anxiety: Secondary | ICD-10-CM | POA: Diagnosis not present

## 2017-03-28 DIAGNOSIS — F4322 Adjustment disorder with anxiety: Secondary | ICD-10-CM | POA: Diagnosis not present

## 2017-04-05 DIAGNOSIS — H401122 Primary open-angle glaucoma, left eye, moderate stage: Secondary | ICD-10-CM | POA: Diagnosis not present

## 2017-04-10 DIAGNOSIS — F4322 Adjustment disorder with anxiety: Secondary | ICD-10-CM | POA: Diagnosis not present

## 2017-04-24 DIAGNOSIS — F4322 Adjustment disorder with anxiety: Secondary | ICD-10-CM | POA: Diagnosis not present

## 2017-05-09 DIAGNOSIS — F4322 Adjustment disorder with anxiety: Secondary | ICD-10-CM | POA: Diagnosis not present

## 2017-05-10 DIAGNOSIS — M1611 Unilateral primary osteoarthritis, right hip: Secondary | ICD-10-CM | POA: Diagnosis not present

## 2017-05-10 DIAGNOSIS — Z471 Aftercare following joint replacement surgery: Secondary | ICD-10-CM | POA: Diagnosis not present

## 2017-05-10 DIAGNOSIS — Z96641 Presence of right artificial hip joint: Secondary | ICD-10-CM | POA: Diagnosis not present

## 2017-05-22 DIAGNOSIS — F4322 Adjustment disorder with anxiety: Secondary | ICD-10-CM | POA: Diagnosis not present

## 2017-05-31 DIAGNOSIS — D692 Other nonthrombocytopenic purpura: Secondary | ICD-10-CM | POA: Diagnosis not present

## 2017-05-31 DIAGNOSIS — I10 Essential (primary) hypertension: Secondary | ICD-10-CM | POA: Diagnosis not present

## 2017-05-31 DIAGNOSIS — F40243 Fear of flying: Secondary | ICD-10-CM | POA: Diagnosis not present

## 2017-05-31 DIAGNOSIS — F330 Major depressive disorder, recurrent, mild: Secondary | ICD-10-CM | POA: Diagnosis not present

## 2017-05-31 DIAGNOSIS — Z0000 Encounter for general adult medical examination without abnormal findings: Secondary | ICD-10-CM | POA: Diagnosis not present

## 2017-06-05 DIAGNOSIS — E782 Mixed hyperlipidemia: Secondary | ICD-10-CM | POA: Diagnosis not present

## 2017-06-05 DIAGNOSIS — Z1159 Encounter for screening for other viral diseases: Secondary | ICD-10-CM | POA: Diagnosis not present

## 2017-06-05 DIAGNOSIS — Z79899 Other long term (current) drug therapy: Secondary | ICD-10-CM | POA: Diagnosis not present

## 2017-06-05 DIAGNOSIS — R7301 Impaired fasting glucose: Secondary | ICD-10-CM | POA: Diagnosis not present

## 2017-06-06 DIAGNOSIS — F4322 Adjustment disorder with anxiety: Secondary | ICD-10-CM | POA: Diagnosis not present

## 2017-06-27 DIAGNOSIS — F4322 Adjustment disorder with anxiety: Secondary | ICD-10-CM | POA: Diagnosis not present

## 2017-07-11 DIAGNOSIS — F4322 Adjustment disorder with anxiety: Secondary | ICD-10-CM | POA: Diagnosis not present

## 2017-07-19 DIAGNOSIS — H401122 Primary open-angle glaucoma, left eye, moderate stage: Secondary | ICD-10-CM | POA: Diagnosis not present

## 2017-07-26 DIAGNOSIS — E2839 Other primary ovarian failure: Secondary | ICD-10-CM | POA: Diagnosis not present

## 2017-08-01 DIAGNOSIS — F4322 Adjustment disorder with anxiety: Secondary | ICD-10-CM | POA: Diagnosis not present

## 2017-08-15 DIAGNOSIS — F4322 Adjustment disorder with anxiety: Secondary | ICD-10-CM | POA: Diagnosis not present

## 2017-08-29 DIAGNOSIS — F4322 Adjustment disorder with anxiety: Secondary | ICD-10-CM | POA: Diagnosis not present

## 2017-09-07 ENCOUNTER — Encounter: Payer: Self-pay | Admitting: Obstetrics & Gynecology

## 2017-09-07 ENCOUNTER — Encounter: Payer: PRIVATE HEALTH INSURANCE | Admitting: Obstetrics & Gynecology

## 2017-09-07 ENCOUNTER — Ambulatory Visit (INDEPENDENT_AMBULATORY_CARE_PROVIDER_SITE_OTHER): Payer: Medicare Other | Admitting: Obstetrics & Gynecology

## 2017-09-07 VITALS — BP 128/70 | Ht 63.0 in | Wt 139.0 lb

## 2017-09-07 DIAGNOSIS — Z01419 Encounter for gynecological examination (general) (routine) without abnormal findings: Secondary | ICD-10-CM | POA: Diagnosis not present

## 2017-09-07 DIAGNOSIS — Z9189 Other specified personal risk factors, not elsewhere classified: Secondary | ICD-10-CM | POA: Diagnosis not present

## 2017-09-07 DIAGNOSIS — Z780 Asymptomatic menopausal state: Secondary | ICD-10-CM

## 2017-09-07 DIAGNOSIS — R1032 Left lower quadrant pain: Secondary | ICD-10-CM | POA: Diagnosis not present

## 2017-09-07 NOTE — Progress Notes (Signed)
Marisa Holden 1945-10-02 [id number]   History:    72 y.o. G0 Married.  Going on a cruise in [place] at the end of September  RP:  Established patient presenting for annual gyn exam   HPI: Postmenopausal, well on no hormone replacement therapy.  No postmenopausal bleeding.  Abstinent.  Complains of intermittent left lower quadrant pain of a crampy nature.  Symptoms appeared at the same time she started on fenofibrate.  Urine and bowel movements normal.  Breasts normal.  Body mass index 24.62.  Health labs with family physician.  Past medical history,surgical history, family history and social history were all reviewed and documented in the EPIC chart.  Gynecologic History No LMP recorded. Patient is postmenopausal. Contraception: post menopausal status Last Pap: 06/2016. Results were: Negative Last mammogram: 12/2016. Results were: Negative Bone Density: 2018 Normal Colonoscopy: 2016  Obstetric History OB History  Gravida Para Term Preterm AB Living  0 0 0 0 0 0  SAB TAB Ectopic Multiple Live Births  0 0 0 0 0     ROS: A ROS was performed and pertinent positives and negatives are included in the history.  GENERAL: No fevers or chills. HEENT: No change in vision, no earache, sore throat or sinus congestion. NECK: No pain or stiffness. CARDIOVASCULAR: No chest pain or pressure. No palpitations. PULMONARY: No shortness of breath, cough or wheeze. GASTROINTESTINAL: No abdominal pain, nausea, vomiting or diarrhea, melena or bright red blood per rectum. GENITOURINARY: No urinary frequency, urgency, hesitancy or dysuria. MUSCULOSKELETAL: No joint or muscle pain, no back pain, no recent trauma. DERMATOLOGIC: No rash, no itching, no lesions. ENDOCRINE: No polyuria, polydipsia, no heat or cold intolerance. No recent change in weight. HEMATOLOGICAL: No anemia or easy bruising or bleeding. NEUROLOGIC: No headache, seizures, numbness, tingling or weakness. PSYCHIATRIC: No depression, no loss  of interest in normal activity or change in sleep pattern.     Exam:   BP 128/70   Ht 5\' 3"  (1.6 m)   Wt 139 lb (63 kg)   BMI 24.62 kg/m   Body mass index is 24.62 kg/m.  General appearance : Well developed well nourished female. No acute distress HEENT: Eyes: no retinal hemorrhage or exudates,  Neck supple, trachea midline, no carotid bruits, no thyroidmegaly Lungs: Clear to auscultation, no rhonchi or wheezes, or rib retractions  Heart: Regular rate and rhythm, no murmurs or gallops Breast:Examined in sitting and supine position were symmetrical in appearance, no palpable masses or tenderness,  no skin retraction, no nipple inversion, no nipple discharge, no skin discoloration, no axillary or supraclavicular lymphadenopathy Abdomen: no palpable masses or tenderness, no rebound or guarding Extremities: no edema or skin discoloration or tenderness  Pelvic: Vulva: Normal             Vagina: No gross lesions or discharge  Cervix: No gross lesions or discharge  Uterus  AV, normal size, shape and consistency, non-tender and mobile  Adnexa  Without masses or tenderness  Anus: Normal   Assessment/Plan:  72 y.o. female for annual exam   1. Well female exam with routine gynecological exam Normal gynecologic exam and menopause.  Pap test negative in June 2018.  Breast exam normal.  Screening mammogram negative in December 2018.  Health labs with family physician.  Last colonoscopy in 2016.  2. Post-menopausal Well on no hormone replacement therapy.  No postmenopausal bleeding.  Last bone density in 2018 was normal.  Recommend vitamin D supplements, calcium intake of 1.5 g/day including  nutritional and supplemental, regular weightbearing physical activity.  3. Left lower quadrant pain Left pelvic discomfort probably associated with treatment with fenofibrate.  Patient will try to stop for a week or 2 and see if the pain resolves.  Will contact her family physician for long-term  management of her cholesterol.  In the meantime, will rule out any gynecologic pathology with a pelvic ultrasound, which patient will schedule as soon as possible. - US Transvaginal Non-OB; Future  Counseling on above issues and coordination of care more than 50% for 10 minutes.  [name] MD, 2:51 PM 09/07/2017

## 2017-09-08 ENCOUNTER — Encounter: Payer: Self-pay | Admitting: Obstetrics & Gynecology

## 2017-09-08 NOTE — Patient Instructions (Signed)
1. Well female exam with routine gynecological exam Normal gynecologic exam and menopause.  Pap test negative in June 2018.  Breast exam normal.  Screening mammogram negative in December 2018.  Health labs with family physician.  Last colonoscopy in 2016.  2. Post-menopausal Well on no hormone replacement therapy.  No postmenopausal bleeding.  Last bone density in 2018 was normal.  Recommend vitamin D supplements, calcium intake of 1.5 g/day including nutritional and supplemental, regular weightbearing physical activity.  3. Left lower quadrant pain Left pelvic discomfort probably associated with treatment with fenofibrate.  Patient will try to stop for a week or 2 and see if the pain resolves.  Will contact her family physician for long-term management of her cholesterol.  In the meantime, will rule out any gynecologic pathology with a pelvic ultrasound, which patient will schedule as soon as possible. - US Transvaginal Non-OB; Future  Eric, it was a pleasure seeing you today!

## 2017-09-10 DIAGNOSIS — Z23 Encounter for immunization: Secondary | ICD-10-CM | POA: Diagnosis not present

## 2017-09-11 ENCOUNTER — Ambulatory Visit (INDEPENDENT_AMBULATORY_CARE_PROVIDER_SITE_OTHER): Payer: Medicare Other

## 2017-09-11 ENCOUNTER — Other Ambulatory Visit: Payer: Self-pay | Admitting: Obstetrics & Gynecology

## 2017-09-11 ENCOUNTER — Ambulatory Visit (INDEPENDENT_AMBULATORY_CARE_PROVIDER_SITE_OTHER): Payer: Medicare Other | Admitting: Obstetrics & Gynecology

## 2017-09-11 ENCOUNTER — Encounter: Payer: Self-pay | Admitting: Obstetrics & Gynecology

## 2017-09-11 DIAGNOSIS — R1032 Left lower quadrant pain: Secondary | ICD-10-CM

## 2017-09-11 DIAGNOSIS — R9389 Abnormal findings on diagnostic imaging of other specified body structures: Secondary | ICD-10-CM

## 2017-09-11 DIAGNOSIS — R102 Pelvic and perineal pain: Secondary | ICD-10-CM | POA: Diagnosis not present

## 2017-09-11 NOTE — Progress Notes (Signed)
    Marisa Holden Jul 15, 1945 [id number]        72 y.o.  G0  RP: Left pelvic pressure/pain for Pelvic US  HPI: Intermittent left pelvic pressure/pain.  May be associated with starting on Fenofibrate.   OB History  Gravida Para Term Preterm AB Living  0 0 0 0 0 0  SAB TAB Ectopic Multiple Live Births  0 0 0 0 0    Past medical history,surgical history, problem list, medications, allergies, family history and social history were all reviewed and documented in the EPIC chart.   Directed ROS with pertinent positives and negatives documented in the history of present illness/assessment and plan.  Exam:  There were no vitals filed for this visit. General appearance:  Normal  Pelvic US today: T/V images.  Uterus anteverted heterogeneous with scattered cystic areas measuring 7.27 x 4.18 x 3.64 cm.  Endometrial lining thickened with ill-defined borders and positive color flow Doppler within the endometrium, measured at 14.3 mm.  No discrete mass seen within the endometrium.  Right and left ovaries normal with atrophic echogenicity.  No apparent mass in the right or left adnexa.  No free fluid in the posterior cul-de-sac.   Assessment/Plan:  72 y.o. G0  1. Pelvic pain in female Left pelvic pressure.  Pelvic ultrasound today reviewed with patient.  Uterus normal volume with small cystic areas.  Right and left ovaries normal with no apparent mass in the right or left adnexa and no free fluid in the posterior cul-de-sac.  Patient reassured.  2. Thickened endometrium Incidental finding of a thickened endometrium with positive color flow Doppler.  Will proceed with a sonohysterogram, possible endometrial biopsy.  Procedure explained to patient.  Will schedule today in follow-up for the procedure. - [place] Sonohysterogram; Future  Counseling on above issues and coordination of care more than 50% for 15 minutes.  [name] MD, 3:11 PM 09/11/2017

## 2017-09-11 NOTE — Patient Instructions (Signed)
1. Pelvic pain in female Left pelvic pressure.  Pelvic ultrasound today reviewed with patient.  Uterus normal volume with small cystic areas.  Right and left ovaries normal with no apparent mass in the right or left adnexa and no free fluid in the posterior cul-de-sac.  Patient reassured.  2. Thickened endometrium Incidental finding of a thickened endometrium with positive color flow Doppler.  Will proceed with a sonohysterogram, possible endometrial biopsy.  Procedure explained to patient.  Will schedule today in follow-up for the procedure. - [place] Sonohysterogram; Future  Shawn, good seeing you today!

## 2017-09-19 DIAGNOSIS — F4322 Adjustment disorder with anxiety: Secondary | ICD-10-CM | POA: Diagnosis not present

## 2017-09-25 ENCOUNTER — Other Ambulatory Visit: Payer: Self-pay | Admitting: Obstetrics & Gynecology

## 2017-09-25 DIAGNOSIS — R9389 Abnormal findings on diagnostic imaging of other specified body structures: Secondary | ICD-10-CM

## 2017-09-27 ENCOUNTER — Other Ambulatory Visit: Payer: Self-pay | Admitting: Obstetrics & Gynecology

## 2017-09-27 ENCOUNTER — Other Ambulatory Visit: Payer: Self-pay | Admitting: Anesthesiology

## 2017-09-27 ENCOUNTER — Ambulatory Visit (INDEPENDENT_AMBULATORY_CARE_PROVIDER_SITE_OTHER): Payer: Medicare Other | Admitting: Obstetrics & Gynecology

## 2017-09-27 ENCOUNTER — Ambulatory Visit (INDEPENDENT_AMBULATORY_CARE_PROVIDER_SITE_OTHER): Payer: Medicare Other

## 2017-09-27 DIAGNOSIS — N858 Other specified noninflammatory disorders of uterus: Secondary | ICD-10-CM

## 2017-09-27 DIAGNOSIS — R9389 Abnormal findings on diagnostic imaging of other specified body structures: Secondary | ICD-10-CM

## 2017-09-27 DIAGNOSIS — R102 Pelvic and perineal pain: Secondary | ICD-10-CM | POA: Diagnosis not present

## 2017-09-27 DIAGNOSIS — R935 Abnormal findings on diagnostic imaging of other abdominal regions, including retroperitoneum: Secondary | ICD-10-CM | POA: Diagnosis not present

## 2017-09-27 DIAGNOSIS — N882 Stricture and stenosis of cervix uteri: Secondary | ICD-10-CM

## 2017-09-27 NOTE — Progress Notes (Signed)
    Marisa Holden July 05, 1945 [id number]        72 y.o.  G0 Married  RP: Thickened Endometrium for Sonohysterogram  HPI: Pelvic discomfort resolved, probably due to intestinal gas per patient.  Pelvic US done for pelvic pain was normal except for a thickened endometrial line at 14.3 mm.  No PMB.  No vaginal discharge.   OB History  Gravida Para Term Preterm AB Living  0 0 0 0 0 0  SAB TAB Ectopic Multiple Live Births  0 0 0 0 0    Past medical history,surgical history, problem list, medications, allergies, family history and social history were all reviewed and documented in the EPIC chart.   Directed ROS with pertinent positives and negatives documented in the history of present illness/assessment and plan.  Exam:  There were no vitals filed for this visit. General appearance:  Normal                                                                    Sono Infusion Hysterogram ( procedure note)   The initial transvaginal ultrasound demonstrated the following:  T/V and T/a images.  Uterus anteverted homogeneous measuring 6.11 x 4.29 x 3.35 cm.  Endometrial line normal measured at 4.3 mm.  Right and left ovaries normal.  No apparent mass in the right or left adnexa.  No free fluid in the posterior cul-de-sac.  As per today's ultrasound findings, a sonohysterogram is not indicated.  But given the previous images showing a thickened endometrium, the decision is made to attempt a sonohysterogram to complete the investigation.  The speculum  was inserted and the cervix cleansed with Betadine solution after confirming that patient has no allergies. Hurricaine spray of the cervix.  A tenaculum is applied on the anterior lip of the cervix.  The cervix is stenotic.  The smallest metal dilator barely fits in.  The patient is experiencing pain.  Given the cervical stenosis and the normal endometrial line today, decision made not to pursue a sonohysterogram.   Assessment/Plan:  72 y.o.  G0  1. Abnormal ultrasound of endometrium Pelvic ultrasound done today revealed a normal thin endometrial line at 4.3 mm.  Comparing the pictures with the previous ultrasound it appears that part of the slightly heterogenous myometrium was included in the endometrial line measurement.  As the patient had no postmenopausal bleeding, has a stenotic cervix and the pictures today are showing a very clear normal thin endometrial line at 4.3 mm, decision not to pursue any investigation further.  Patient reassured.  Patient will call for reevaluation only if she experiences postmenopausal bleeding.  2. Cervical stenosis (uterine cervix) Normal postmenopausal cervical stenosis.  Counseling on above issues and coordination of care more than 50% for 15 minutes.  [name] MD, 9:40 AM 09/27/2017

## 2017-09-29 ENCOUNTER — Encounter: Payer: Self-pay | Admitting: Obstetrics & Gynecology

## 2017-09-29 NOTE — Patient Instructions (Signed)
1. Abnormal ultrasound of endometrium Pelvic ultrasound done today revealed a normal thin endometrial line at 4.3 mm.  Comparing the pictures with the previous ultrasound it appears that part of the slightly heterogenous myometrium was included in the endometrial line measurement.  As the patient had no postmenopausal bleeding, has a stenotic cervix and the pictures today are showing a very clear normal thin endometrial line at 4.3 mm, decision not to pursue any investigation further.  Patient reassured.  Patient will call for reevaluation only if she experiences postmenopausal bleeding.  2. Cervical stenosis (uterine cervix) Normal postmenopausal cervical stenosis.  Marisa Holden, good seeing you today!

## 2017-10-23 DIAGNOSIS — H903 Sensorineural hearing loss, bilateral: Secondary | ICD-10-CM | POA: Diagnosis not present

## 2017-10-24 DIAGNOSIS — F4322 Adjustment disorder with anxiety: Secondary | ICD-10-CM | POA: Diagnosis not present

## 2017-11-15 DIAGNOSIS — M25551 Pain in right hip: Secondary | ICD-10-CM | POA: Diagnosis not present

## 2017-11-15 DIAGNOSIS — M7061 Trochanteric bursitis, right hip: Secondary | ICD-10-CM | POA: Diagnosis not present

## 2017-11-19 DIAGNOSIS — H401122 Primary open-angle glaucoma, left eye, moderate stage: Secondary | ICD-10-CM | POA: Diagnosis not present

## 2017-12-20 DIAGNOSIS — F4322 Adjustment disorder with anxiety: Secondary | ICD-10-CM | POA: Diagnosis not present

## 2018-01-15 DIAGNOSIS — S46012A Strain of muscle(s) and tendon(s) of the rotator cuff of left shoulder, initial encounter: Secondary | ICD-10-CM | POA: Diagnosis not present

## 2018-01-15 DIAGNOSIS — S63502A Unspecified sprain of left wrist, initial encounter: Secondary | ICD-10-CM | POA: Diagnosis not present

## 2018-01-22 DIAGNOSIS — M25512 Pain in left shoulder: Secondary | ICD-10-CM | POA: Diagnosis not present

## 2018-01-24 DIAGNOSIS — M25512 Pain in left shoulder: Secondary | ICD-10-CM | POA: Diagnosis not present

## 2018-01-25 ENCOUNTER — Encounter: Payer: Self-pay | Admitting: Physical Therapy

## 2018-01-25 ENCOUNTER — Ambulatory Visit: Payer: Medicare HMO | Attending: Sports Medicine | Admitting: Physical Therapy

## 2018-01-25 ENCOUNTER — Other Ambulatory Visit: Payer: Self-pay

## 2018-01-25 DIAGNOSIS — M6281 Muscle weakness (generalized): Secondary | ICD-10-CM | POA: Diagnosis present

## 2018-01-25 DIAGNOSIS — M25612 Stiffness of left shoulder, not elsewhere classified: Secondary | ICD-10-CM | POA: Diagnosis present

## 2018-01-25 DIAGNOSIS — M25512 Pain in left shoulder: Secondary | ICD-10-CM | POA: Diagnosis not present

## 2018-01-25 NOTE — Therapy (Signed)
[facility] [address] [place], [place], [address] Phone: [phone]   Fax:  [phone]  Physical Therapy Evaluation  Patient Details  Name: Marisa Holden MRN: [id number] Date of Birth: 1945/11/30 Referring Provider (PT): Dr. Berle Mull   Encounter Date: 01/25/2018  PT End of Session - 01/25/18 1140    Visit Number  1    Date for PT Re-Evaluation  03/22/18    Authorization Type  [employer]ET[employer] Medicare    PT Start Time  1100    PT Stop Time  1135    PT Time Calculation (min)  35 min    Activity Tolerance  Patient tolerated treatment well    Behavior During Therapy  [facility] for tasks assessed/performed       Past Medical History:  Diagnosis Date  . Anxiety    due to surgery  . Arthritis   . Dysrhythmia    heart palpitaions  . Hypertension   . Pre-diabetes    diet controlled    Past Surgical History:  Procedure Laterality Date  . cataract surgery     bilateral with lens implant  . COLONOSCOPY WITH PROPOFOL N/A 04/07/2014   Procedure: COLONOSCOPY WITH PROPOFOL;  Surgeon: Garlan Fair, MD;  Location: [facility];  Service: Endoscopy;  Laterality: N/A;  . EYE SURGERY     left eye macular hole  . TOTAL HIP ARTHROPLASTY Right 04/05/2016   Procedure: RIGHT TOTAL HIP ARTHROPLASTY ANTERIOR APPROACH;  Surgeon: Gaynelle Arabian, MD;  Location: [facility];  Service: Orthopedics;  Laterality: Right;    There were no vitals filed for this visit.   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 12/20/2017 she was cleaning the wheel and leaned on it. The wheel took her arm forward and strained.     Patient Stated Goals  Get shoulder back to normal    Currently in Pain?  Yes    Pain Score  4     Pain Location  Shoulder    Pain Orientation  Left    Pain Descriptors / Indicators  Aching    Pain Type  Acute pain    Pain Onset  More than a month ago    Pain Frequency  Intermittent     Aggravating Factors   sleep on left side has achiness, reaching forward, lifting with the left arm, putting on a sweater and jacket, reaching around the back to fasten her bra    Pain Relieving Factors  arm at side    Multiple Pain Sites  No         [facility] PT Assessment - 01/25/18 0001      Assessment   Medical Diagnosis  rotator cuff tendonitis    Referring Provider (PT)  Dr. Berle Mull    Onset Date/Surgical Date  12/20/17    Prior Therapy  yes      Precautions   Precautions  None      Restrictions   Weight Bearing Restrictions  No      Balance Screen   Has the patient fallen in the past 6 months  No    Has the patient had a decrease in activity level because of a fear of falling?   No    Is the patient reluctant to leave their home because of a fear of falling?   No      Home Film/video editor residence  Prior Function   Level of Independence  Independent    Leisure  pottery, painting      Cognition   Overall Cognitive Status  Within Functional Limits for tasks assessed      Observation/Other Assessments   Focus on Therapeutic Outcomes (FOTO)   44% limitation; goal is 34% limitation      Posture/Postural Control   Posture/Postural Control  No significant limitations      ROM / Strength   AROM / PROM / Strength  AROM;PROM;Strength      AROM   Right Shoulder Extension  135 Degrees    Right Shoulder ABduction  108 Degrees      PROM   Right Shoulder Flexion  138 Degrees    Right Shoulder ABduction  108 Degrees    Right Shoulder External Rotation  65 Degrees      Strength   Right Shoulder Flexion  3-/5    Right Shoulder Extension  4/5    Right Shoulder ABduction  3-/5    Right Shoulder Internal Rotation  4+/5    Right Shoulder External Rotation  4-/5      Palpation   Palpation comment  tenderness located on the left RTC tendon                Objective measurements completed on examination: See above findings.               PT Education - 01/25/18 1135    Education Details  Access Code: [email or web address]      Person(s) Educated  Patient    Methods  Explanation;Demonstration;Verbal cues;Handout    Comprehension  Returned demonstration;Verbalized understanding       PT Short Term Goals - 01/25/18 1147      PT SHORT TERM GOAL #1   Title  independent with initial HEP    Time  4    Period  Weeks    Status  New    Target Date  02/22/18      PT SHORT TERM GOAL #2   Title  lay on left shoulder with pain decreased >/= 25%    Time  4    Period  Weeks    Status  New    Target Date  02/22/18      PT SHORT TERM GOAL #3   Title  left shoulder A/ROM for abduction >/= 130 degrees due to less pain and improved mobility    Time  4    Period  Weeks    Status  New    Target Date  02/22/18        PT Long Term Goals - 01/25/18 1130      PT LONG TERM GOAL #1   Title  Patient independent with HEP and understand how to progress himself    Time  8    Period  Weeks    Target Date  03/22/18      PT LONG TERM GOAL #2   Title  reach into a coat or putting on a sweater with pain level decreased >/= 80%    Time  8    Period  Weeks    Status  New    Target Date  03/22/18      PT LONG TERM GOAL #3   Title  ability to reach out to the side at 90 degrees abduction to place ticket in a garage with pain level decreased >/= 80%    Time  8    Period  Weeks    Target Date  03/22/18      PT LONG TERM GOAL #4   Title  reach in an overhead shelf to get a plate due to left shoulder flexion A/ROM >/= 145 degrees with minimal pain and strength >/= 4/5    Time  8    Period  Weeks    Status  New    Target Date  03/22/18      PT LONG TERM GOAL #5   Title  sleep on left side with minmal to no pain due to reduction of inflammation    Time  8    Period  Weeks    Status  New    Target Date  03/22/18             Plan - 01/25/18 1141    Clinical Impression Statement  Patient injured her left arm on  12/20/2017 when she was cleaning a pottery wheel. Patient leaned on a wheel that turned on and jerked her shoulder. Patient reports intermittent pain in left shoulder at 4/10. Patient pain is worse with reaching overhead and to the side, laying on left shoulder, and putting clothes on. Patient has decresaed ROM of left shoulder.  Patient left shoulder strength is averaging 3+/5.  Patient has tenderness located on the left RTC tendon. Patient will benefit from skilled therapy to improve left shoulder ROM and strength while improving function.     History and Personal Factors relevant to plan of care:  none    Clinical Presentation  Stable    Clinical Presentation due to:  stable condition    Clinical Decision Making  Low    Rehab Potential  Excellent    Clinical Impairments Affecting Rehab Potential  none    PT Frequency  2x / week    PT Duration  8 weeks    PT Treatment/Interventions  Cryotherapy;Electrical Stimulation;Iontophoresis 4mg /ml Dexamethasone;Moist Heat;Ultrasound;Therapeutic activities;Therapeutic exercise;Neuromuscular re-education;Manual techniques;Patient/family education;Passive range of motion;Dry needling;Taping;Joint Manipulations    PT Next Visit Plan  ionto if MD signes the note; left shoulder mobilization, shoulder isometrics, scapula retraction; overhead pulleys; shoulder ranger    PT Home Exercise Plan  Access Code: [email or web address]      Consulted and Agree with Plan of Care  Patient       Patient will benefit from skilled therapeutic intervention in order to improve the following deficits and impairments:  Increased fascial restricitons, Pain, Decreased coordination, Decreased mobility, Increased muscle spasms, Decreased activity tolerance, Decreased endurance, Decreased range of motion, Decreased strength  Visit Diagnosis: Acute pain of left shoulder - Plan: PT plan of care cert/re-cert  Stiffness of left shoulder, not elsewhere classified - Plan: PT plan of care  cert/re-cert  Muscle weakness (generalized) - Plan: PT plan of care cert/re-cert     Problem List Patient Active Problem List   Diagnosis Date Noted  . OA (osteoarthritis) of hip 04/05/2016    Earlie Counts, PT 01/25/18 11:51 AM   [facility] [address], [place], [place], [address] Phone: [phone]   Fax:  [phone]  Name: Marisa Holden MRN: [id number] Date of Birth: 09-Jan-1946

## 2018-01-25 NOTE — Patient Instructions (Signed)
Access Code: [email or web address]   URL: [email or web address]  Date: 01/25/2018  Prepared by: Earlie Counts   Exercises  Supine Shoulder Flexion Extension AAROM with Dowel - 10 reps - 1 sets - 1 sec hold - 2x daily - 7x weekly  Supine Shoulder Protraction with Dowel - 10 reps - 1 sets - 1 sec hold - 2x daily - 7x weekly  Supine Shoulder External Rotation with Dowel - 10 reps - 1 sets - 1 sec hold - 2x daily - 7x weekly  Horizontal Shoulder Pendulum with Table Support - 10 reps - 1 sets - 3x daily - 7x weekly  Flexion-Extension Shoulder Pendulum with Table Support - 10 reps - 1 sets - 3x daily - 7x weekly  [facility] [address], [place], [place] [address] Phone # [phone] Fax [phone]

## 2018-01-28 ENCOUNTER — Encounter: Payer: Self-pay | Admitting: Physical Therapy

## 2018-01-28 ENCOUNTER — Ambulatory Visit: Payer: Medicare HMO | Admitting: Physical Therapy

## 2018-01-28 DIAGNOSIS — M6281 Muscle weakness (generalized): Secondary | ICD-10-CM | POA: Diagnosis not present

## 2018-01-28 DIAGNOSIS — M25512 Pain in left shoulder: Secondary | ICD-10-CM

## 2018-01-28 DIAGNOSIS — M25612 Stiffness of left shoulder, not elsewhere classified: Secondary | ICD-10-CM | POA: Diagnosis not present

## 2018-01-28 NOTE — Therapy (Signed)
[facility] [address] [place], [place], [address] Phone: [phone]   Fax:  [phone]  Physical Therapy Treatment  Patient Details  Name: Marisa Holden MRN: [id number] Date of Birth: [birth year] Referring Provider (PT): Dr. Berle Mull   Encounter Date: 01/28/2018  PT End of Session - 01/28/18 1403    Visit Number  2    Date for PT Re-Evaluation  03/22/18    Authorization Type  AETNA Medicare    PT Start Time  1401    PT Stop Time  1446    PT Time Calculation (min)  45 min    Activity Tolerance  Patient tolerated treatment well    Behavior During Therapy  [facility] for tasks assessed/performed       Past Medical History:  Diagnosis Date  . Anxiety    due to surgery  . Arthritis   . Dysrhythmia    heart palpitaions  . Hypertension   . Pre-diabetes    diet controlled    Past Surgical History:  Procedure Laterality Date  . cataract surgery     bilateral with lens implant  . COLONOSCOPY WITH PROPOFOL N/A 04/07/2014   Procedure: COLONOSCOPY WITH PROPOFOL;  Surgeon: Garlan Fair, MD;  Location: [facility];  Service: Endoscopy;  Laterality: N/A;  . EYE SURGERY     left eye macular hole  . TOTAL HIP ARTHROPLASTY Right 04/05/2016   Procedure: RIGHT TOTAL HIP ARTHROPLASTY ANTERIOR APPROACH;  Surgeon: Gaynelle Arabian, MD;  Location: [facility];  Service: Orthopedics;  Laterality: Right;    There were no vitals filed for this visit.  Subjective Assessment - 01/28/18 1359    Subjective  Pt states pain is worse at night when trying to sleep on Lt side due to hip replacement on Rt.  Pain is about the same, started the Advil.  HEP going well.    Patient Stated Goals  Get shoulder back to normal    Currently in Pain?  Yes    Pain Score  4     Pain Location  Shoulder    Pain Orientation  Left    Pain Descriptors / Indicators  Aching    Pain Type  Acute pain    Pain Onset  More than a month ago    Pain Frequency   Intermittent    Aggravating Factors   sleep on Lt side                       [facility] Adult PT Treatment/Exercise - 01/28/18 0001      Exercises   Exercises  Shoulder      Shoulder Exercises: Seated   Other Seated Exercises  shoulder rolls backward x 10    PT cued emphasis on retraction/depression   Other Seated Exercises  seated row x 10    PT tactile cues for scapulae     Shoulder Exercises: Standing   Extension  Strengthening;Both;Theraband;15 reps    Theraband Level (Shoulder Extension)  Level 1 (Yellow)    Row  Strengthening;Both;15 reps;Theraband    Theraband Level (Shoulder Row)  Level 1 (Yellow)      Shoulder Exercises: Pulleys   Flexion  2 minutes    Scaption  2 minutes    ABduction  --   attempted but pain     Shoulder Exercises: ROM/Strengthening   Other ROM/Strengthening Exercises  finger ladder, 3 reps, reached step 20 without pain    Other ROM/Strengthening Exercises  AA/ROM with cane supine flexion, protraction, ER/IR x 15 each   PT cued scapular control to avoid painful arc with eccentric     Shoulder Exercises: Isometric Strengthening   Flexion  5X5"    Flexion Limitations  elbow bent    External Rotation  5X5"    External Rotation Limitations  towel roll inside elbow    ABduction  5X5"    ABduction Limitations  towel roll inside elbow      Manual Therapy   Manual Therapy  Joint mobilization    Manual therapy comments  --    Joint Mobilization  Lt GH joint distraction in supine Gr I/II, post glide Gr II/III             PT Education - 01/28/18 1459    Education Details  Access Code: [email or web address]      Person(s) Educated  Patient    Methods  Explanation;Demonstration;Verbal cues;Handout;Tactile cues    Comprehension  Verbalized understanding;Returned demonstration       PT Short Term Goals - 01/28/18 1457      PT SHORT TERM GOAL #1   Title  independent with initial HEP    Status  On-going      PT SHORT TERM GOAL #3   Title   left shoulder A/ROM for abduction >/= 130 degrees due to less pain and improved mobility    Status  On-going   able to reach 130+ in scaption today but not abduction secondary to pain       PT Long Term Goals - 01/25/18 1130      PT LONG TERM GOAL #1   Title  Patient independent with HEP and understand how to progress himself    Time  8    Period  Weeks    Target Date  03/22/18      PT LONG TERM GOAL #2   Title  reach into a coat or putting on a sweater with pain level decreased >/= 80%    Time  8    Period  Weeks    Status  New    Target Date  03/22/18      PT LONG TERM GOAL #3   Title  ability to reach out to the side at 90 degrees abduction to place ticket in a garage with pain level decreased >/= 80%    Time  8    Period  Weeks    Target Date  03/22/18      PT LONG TERM GOAL #4   Title  reach in an overhead shelf to get a plate due to left shoulder flexion A/ROM >/= 145 degrees with minimal pain and strength >/= 4/5    Time  8    Period  Weeks    Status  New    Target Date  03/22/18      PT LONG TERM GOAL #5   Title  sleep on left side with minmal to no pain due to reduction of inflammation    Time  8    Period  Weeks    Status  New    Target Date  03/22/18            Plan - 01/28/18 1457    Clinical Impression Statement  Pt initially had pain with A/AROM with dowel during eccentric phase of flexion and with ER but was able to eliminate pain with PT cue to gently engaging scapular retraction during ROM.  She was able to perform new scapular  ther ex and shoulder isometrics without pain but did note shoulder fatigue end of session.  She will continue to benefit from skilled PT for A/AROM, scapular and glenohumeral stabilization and strengthening, manual techniques and modalities for pain, and progression of HEP.  Cert was not yet signed today, but when it is, consider dexamethasone patch for Lt shoulder pain and tenderness.      Rehab Potential  Excellent    PT  Frequency  2x / week    PT Duration  8 weeks    PT Treatment/Interventions  Cryotherapy;Electrical Stimulation;Iontophoresis 4mg /ml Dexamethasone;Moist Heat;Ultrasound;Therapeutic activities;Therapeutic exercise;Neuromuscular re-education;Manual techniques;Patient/family education;Passive range of motion;Dry needling;Taping;Joint Manipulations    PT Next Visit Plan  ionto if MD signes the note; f/u on HEP, left shoulder mobilization, shoulder isometrics, scapula retraction; overhead pulleys; shoulder ranger    PT Home Exercise Plan  Access Code: [email or web address]      Consulted and Agree with Plan of Care  Patient       Patient will benefit from skilled therapeutic intervention in order to improve the following deficits and impairments:  Increased fascial restricitons, Pain, Decreased coordination, Decreased mobility, Increased muscle spasms, Decreased activity tolerance, Decreased endurance, Decreased range of motion, Decreased strength  Visit Diagnosis: Acute pain of left shoulder  Stiffness of left shoulder, not elsewhere classified  Muscle weakness (generalized)     Problem List Patient Active Problem List   Diagnosis Date Noted  . OA (osteoarthritis) of hip 04/05/2016   Baruch Merl, PT 01/28/18 3:02 PM   [facility] [address], [place], [place], [address] Phone: [phone]   Fax:  [phone]  Name: Marisa Holden MRN: [id number] Date of Birth: 08-18-1945

## 2018-01-28 NOTE — Patient Instructions (Signed)
Access Code: [email or web address]   URL: [email or web address]  Date: 01/28/2018  Prepared by: Venetia Night Carisha Kantor   Exercises  Supine Shoulder Flexion Extension AAROM with Dowel - 10 reps - 1 sets - 1 sec hold - 2x daily - 7x weekly  Supine Shoulder Protraction with Dowel - 10 reps - 1 sets - 1 sec hold - 2x daily - 7x weekly  Supine Shoulder External Rotation with Dowel - 10 reps - 1 sets - 1 sec hold - 2x daily - 7x weekly  Horizontal Shoulder Pendulum with Table Support - 10 reps - 1 sets - 3x daily - 7x weekly  Flexion-Extension Shoulder Pendulum with Table Support - 10 reps - 1 sets - 3x daily - 7x weekly  Standing Row with Anchored Resistance - 15 reps - 2 sets - 1x daily - 7x weekly  Shoulder extension with resistance - Neutral - 10 reps - 3 sets - 1x daily - 7x weekly  Isometric Shoulder Flexion at Wall - 5 reps - 1 sets - 5 hold - 1x daily - 7x weekly  Isometric Shoulder External Rotation at Wall - 5 reps - 1 sets - 5 hold - 1x daily - 7x weekly  Isometric Shoulder Abduction at Wall - 5 reps - 1 sets - 5 hold - 1x daily - 7x weekly

## 2018-01-31 ENCOUNTER — Ambulatory Visit: Payer: Medicare HMO | Admitting: Physical Therapy

## 2018-01-31 ENCOUNTER — Encounter: Payer: Self-pay | Admitting: Physical Therapy

## 2018-01-31 DIAGNOSIS — M6281 Muscle weakness (generalized): Secondary | ICD-10-CM

## 2018-01-31 DIAGNOSIS — M25512 Pain in left shoulder: Secondary | ICD-10-CM

## 2018-01-31 DIAGNOSIS — M25612 Stiffness of left shoulder, not elsewhere classified: Secondary | ICD-10-CM

## 2018-01-31 NOTE — Therapy (Signed)
[facility] [address] [place], [place], [address] Phone: [phone]   Fax:  [phone]  Physical Therapy Treatment  Patient Details  Name: Marisa Holden MRN: [id number] Date of Birth: May 24, 1945 Referring Provider (PT): Dr. Berle Mull   Encounter Date: 01/31/2018  PT End of Session - 01/31/18 1324    Visit Number  3    Date for PT Re-Evaluation  03/22/18    Authorization Type  [employer] Medicare    PT Start Time  7353    PT Stop Time  1315    PT Time Calculation (min)  45 min    Activity Tolerance  Patient tolerated treatment well;No increased pain    Behavior During Therapy  WFL for tasks assessed/performed       Past Medical History:  Diagnosis Date  . Anxiety    due to surgery  . Arthritis   . Dysrhythmia    heart palpitaions  . Hypertension   . Pre-diabetes    diet controlled    Past Surgical History:  Procedure Laterality Date  . cataract surgery     bilateral with lens implant  . COLONOSCOPY WITH PROPOFOL N/A 04/07/2014   Procedure: COLONOSCOPY WITH PROPOFOL;  Surgeon: Garlan Fair, MD;  Location: [facility];  Service: Endoscopy;  Laterality: N/A;  . EYE SURGERY     left eye macular hole  . TOTAL HIP ARTHROPLASTY Right 04/05/2016   Procedure: RIGHT TOTAL HIP ARTHROPLASTY ANTERIOR APPROACH;  Surgeon: Gaynelle Arabian, MD;  Location: [facility];  Service: Orthopedics;  Laterality: Right;    There were no vitals filed for this visit.  Subjective Assessment - 01/31/18 1235    Subjective  I have pain with the isometric shoulder exercise.     Patient Stated Goals  Get shoulder back to normal    Currently in Pain?  Yes    Pain Score  2     Pain Location  Shoulder    Pain Orientation  Left    Pain Descriptors / Indicators  Aching    Pain Type  Acute pain    Pain Onset  More than a month ago    Pain Frequency  Intermittent    Aggravating Factors   sleep on left side    Pain Relieving Factors   arm at side    Multiple Pain Sites  No                       [facility] Adult PT Treatment/Exercise - 01/31/18 0001      Shoulder Exercises: Standing   Other Standing Exercises  UE ranger for shoulder flexion at different levels for range, abduction to 90 degrees with therapist guiding scapula      Modalities   Modalities  Iontophoresis      Iontophoresis   Type of Iontophoresis  Dexamethasone    Location  left RTC tendon    Dose  1 ml, #1    Time  6 hour patch      Manual Therapy   Manual Therapy  Joint mobilization;Soft tissue mobilization    Joint Mobilization  Lt GH joint distraction in supine Gr I/II, post glide Gr II/III; left upper rib cage mobilization ant.  to posterior    Soft tissue mobilization  left scalenes, pectoralis, upper trap, rhomboids, RTC, intercoastals, and left cervical paraspinals       Trigger Point Dry Needling - 01/31/18 1244    Consent Given?  Yes  Education Handout Provided  Yes    Muscles Treated Upper Body  Upper trapezius;Levator scapulae;Rhomboids    Upper Trapezius Response  Twitch reponse elicited;Palpable increased muscle length    Levator Scapulae Response  Twitch response elicited;Palpable increased muscle length    Rhomboids Response  Palpable increased muscle length;Twitch response elicited           PT Education - 01/31/18 1319    Education Details  information for dry needling and iontophoresis    Person(s) Educated  Patient    Methods  Explanation;Demonstration;Verbal cues;Handout    Comprehension  Verbalized understanding       PT Short Term Goals - 01/28/18 1457      PT SHORT TERM GOAL #1   Title  independent with initial HEP    Status  On-going      PT SHORT TERM GOAL #3   Title  left shoulder A/ROM for abduction >/= 130 degrees due to less pain and improved mobility    Status  On-going   able to reach 130+ in scaption today but not abduction secondary to pain       PT Long Term Goals - 01/25/18  1130      PT LONG TERM GOAL #1   Title  Patient independent with HEP and understand how to progress himself    Time  8    Period  Weeks    Target Date  03/22/18      PT LONG TERM GOAL #2   Title  reach into a coat or putting on a sweater with pain level decreased >/= 80%    Time  8    Period  Weeks    Status  New    Target Date  03/22/18      PT LONG TERM GOAL #3   Title  ability to reach out to the side at 90 degrees abduction to place ticket in a garage with pain level decreased >/= 80%    Time  8    Period  Weeks    Target Date  03/22/18      PT LONG TERM GOAL #4   Title  reach in an overhead shelf to get a plate due to left shoulder flexion A/ROM >/= 145 degrees with minimal pain and strength >/= 4/5    Time  8    Period  Weeks    Status  New    Target Date  03/22/18      PT LONG TERM GOAL #5   Title  sleep on left side with minmal to no pain due to reduction of inflammation    Time  8    Period  Weeks    Status  New    Target Date  03/22/18            Plan - 01/31/18 1236    Clinical Impression Statement  Patient had many trigger points in the left upper trap, levator scapula, pectoralis, rhomboids, RTC, and subscapularis. After therapy she had increase mobility of left upper rib cage. Patient continues to have pain in midrange of left shoulder abduction.     Rehab Potential  Excellent    Clinical Impairments Affecting Rehab Potential  none    PT Frequency  2x / week    PT Duration  8 weeks    PT Treatment/Interventions  Cryotherapy;Electrical Stimulation;Iontophoresis 4mg /ml Dexamethasone;Moist Heat;Ultrasound;Therapeutic activities;Therapeutic exercise;Neuromuscular re-education;Manual techniques;Patient/family education;Passive range of motion;Dry needling;Taping;Joint Manipulations    PT Next Visit Plan  , left  shoulder mobilization, shoulder isometrics, scapula retraction;  shoulder ranger; assess dry needling; work on opening up the left chest wall    PT  Home Exercise Plan  Access Code: [email or web address]      Recommended Other Services  MD signed initial eval    Consulted and Agree with Plan of Care  Patient       Patient will benefit from skilled therapeutic intervention in order to improve the following deficits and impairments:  Increased fascial restricitons, Pain, Decreased coordination, Decreased mobility, Increased muscle spasms, Decreased activity tolerance, Decreased endurance, Decreased range of motion, Decreased strength  Visit Diagnosis: Acute pain of left shoulder  Stiffness of left shoulder, not elsewhere classified  Muscle weakness (generalized)     Problem List Patient Active Problem List   Diagnosis Date Noted  . OA (osteoarthritis) of hip 04/05/2016    Earlie Counts, PT 01/31/18 1:28 PM   [facility] [address], [place], [place], [address] Phone: [phone]   Fax:  [phone]  Name: Marisa Holden MRN: [id number] Date of Birth: 30-Mar-1945

## 2018-01-31 NOTE — Patient Instructions (Signed)
IONTOPHORESIS PATIENT PRECAUTIONS & CONTRAINDICATIONS:  . Redness under one or both electrodes can occur.  This characterized by a uniform redness that usually disappears within 12 hours of treatment. . Small pinhead size blisters may result in response to the drug.  Contact your physician if the problem persists more than 24 hours. . On rare occasions, iontophoresis therapy can result in temporary skin reactions such as rash, inflammation, irritation or burns.  The skin reactions may be the result of individual sensitivity to the ionic solution used, the condition of the skin at the start of treatment, reaction to the materials in the electrodes, allergies or sensitivity to dexamethasone, or a poor connection between the patch and your skin.  Discontinue using iontophoresis if you have any of these reactions and report to your therapist. . Remove the Patch or electrodes if you have any undue sensation of pain or burning during the treatment and report discomfort to your therapist. . Tell your Therapist if you have had known adverse reactions to the application of electrical current. . If using the Patch, the LED light will turn off when treatment is complete and the patch can be removed.  Approximate treatment time is 1-3 hours.  Remove the patch when light goes off or after 6 hours. . The Patch can be worn during normal activity, however excessive motion where the electrodes have been placed can cause poor contact between the skin and the electrode or uneven electrical current resulting in greater risk of skin irritation. . Keep out of the reach of children.   . DO NOT use if you have a cardiac pacemaker or any other electrically sensitive implanted device. . DO NOT use if you have a known sensitivity to dexamethasone. . DO NOT use during Magnetic Resonance Imaging (MRI). . DO NOT use over broken or compromised skin (e.g. sunburn, cuts, or acne) due to the increased risk of skin reaction. . DO  NOT SHAVE over the area to be treated:  To establish good contact between the Patch and the skin, excessive hair may be clipped. . DO NOT place the Patch or electrodes on or over your eyes, directly over your heart, or brain. . DO NOT reuse the Patch or electrodes as this may cause burns to occur.  Trigger Point Dry Needling  . What is Trigger Point Dry Needling (DN)? o DN is a physical therapy technique used to treat muscle pain and dysfunction. Specifically, DN helps deactivate muscle trigger points (muscle knots).  o A thin filiform needle is used to penetrate the skin and stimulate the underlying trigger point. The goal is for a local twitch response (LTR) to occur and for the trigger point to relax. No medication of any kind is injected during the procedure.   . What Does Trigger Point Dry Needling Feel Like?  o The procedure feels different for each individual patient. Some patients report that they do not actually feel the needle enter the skin and overall the process is not painful. Very mild bleeding may occur. However, many patients feel a deep cramping in the muscle in which the needle was inserted. This is the local twitch response.   . How Will I feel after the treatment? o Soreness is normal, and the onset of soreness may not occur for a few hours. Typically this soreness does not last longer than two days.  o Bruising is uncommon, however; ice can be used to decrease any possible bruising.  o In rare cases feeling tired or   nauseous after the treatment is normal. In addition, your symptoms may get worse before they get better, this period will typically not last longer than 24 hours.   . What Can I do After My Treatment? o Increase your hydration by drinking more water for the next 24 hours. o You may place ice or heat on the areas treated that have become sore, however, do not use heat on inflamed or bruised areas. Heat often brings more relief post needling. o You can continue  your regular activities, but vigorous activity is not recommended initially after the treatment for 24 hours. o DN is best combined with other physical therapy such as strengthening, stretching, and other therapies.    [facility] [address], [address] [place], [place] [address] Phone # [phone] Fax [phone]  

## 2018-02-01 ENCOUNTER — Encounter

## 2018-02-01 DIAGNOSIS — E785 Hyperlipidemia, unspecified: Secondary | ICD-10-CM | POA: Diagnosis not present

## 2018-02-04 ENCOUNTER — Encounter: Payer: Self-pay | Admitting: Physical Therapy

## 2018-02-04 ENCOUNTER — Ambulatory Visit: Payer: Medicare HMO | Admitting: Physical Therapy

## 2018-02-04 DIAGNOSIS — M25612 Stiffness of left shoulder, not elsewhere classified: Secondary | ICD-10-CM | POA: Diagnosis not present

## 2018-02-04 DIAGNOSIS — M25512 Pain in left shoulder: Secondary | ICD-10-CM | POA: Diagnosis not present

## 2018-02-04 DIAGNOSIS — M6281 Muscle weakness (generalized): Secondary | ICD-10-CM

## 2018-02-04 NOTE — Patient Instructions (Signed)
Access Code: [email or web address]   URL: [email or web address]  Date: 02/04/2018  Prepared by: Earlie Counts   Exercises  Supine Shoulder Flexion Extension AAROM with Dowel - 10 reps - 1 sets - 1 sec hold - 2x daily - 7x weekly  Supine Shoulder Protraction with Dowel - 10 reps - 1 sets - 1 sec hold - 2x daily - 7x weekly  Supine Shoulder External Rotation with Dowel - 10 reps - 1 sets - 1 sec hold - 2x daily - 7x weekly  Horizontal Shoulder Pendulum with Table Support - 10 reps - 1 sets - 3x daily - 7x weekly  Flexion-Extension Shoulder Pendulum with Table Support - 10 reps - 1 sets - 3x daily - 7x weekly  Standing Row with Anchored Resistance - 15 reps - 2 sets - 1x daily - 7x weekly  Shoulder extension with resistance - Neutral - 10 reps - 3 sets - 1x daily - 7x weekly  Isometric Shoulder Flexion at Wall - 5 reps - 1 sets - 5 hold - 1x daily - 7x weekly  Isometric Shoulder External Rotation at Wall - 5 reps - 1 sets - 5 hold - 1x daily - 7x weekly  Isometric Shoulder Abduction at Wall - 5 reps - 1 sets - 5 hold - 1x daily - 7x weekly  Sidelying Shoulder External Rotation AROM - 10 reps - 2 sets - 1x daily - 7x weekly  Sidelying Shoulder Abduction Palm Forward - 10 reps - 1 sets - 1x daily - 7x weekly  Supine Single Arm Shoulder Protraction - 10 reps - 2 sets - 1x daily - 7x weekly  [facility] [address], [place], [place] [address] Phone # [phone] Fax [phone]

## 2018-02-04 NOTE — Therapy (Signed)
[facility] [address], [place], [place], [address] Phone: [phone]   Fax:  [phone]  Physical Therapy Treatment  Patient Details  Name: Marisa Holden MRN: [id number] Date of Birth: 1945/09/04 Referring Provider (PT): Dr. Berle Mull   Encounter Date: 02/04/2018  PT End of Session - 02/04/18 1444    Visit Number  4    Date for PT Re-Evaluation  03/22/18    Authorization Type  [employer] Medicare    PT Start Time  1400    PT Stop Time  1443    PT Time Calculation (min)  43 min    Activity Tolerance  Patient tolerated treatment well;No increased pain    Behavior During Therapy  WFL for tasks assessed/performed       Past Medical History:  Diagnosis Date  . Anxiety    due to surgery  . Arthritis   . Dysrhythmia    heart palpitaions  . Hypertension   . Pre-diabetes    diet controlled    Past Surgical History:  Procedure Laterality Date  . cataract surgery     bilateral with lens implant  . COLONOSCOPY WITH PROPOFOL N/A 04/07/2014   Procedure: COLONOSCOPY WITH PROPOFOL;  Surgeon: Garlan Fair, MD;  Location: [facility];  Service: Endoscopy;  Laterality: N/A;  . EYE SURGERY     left eye macular hole  . TOTAL HIP ARTHROPLASTY Right 04/05/2016   Procedure: RIGHT TOTAL HIP ARTHROPLASTY ANTERIOR APPROACH;  Surgeon: Gaynelle Arabian, MD;  Location: [facility];  Service: Orthopedics;  Laterality: Right;    There were no vitals filed for this visit.  Subjective Assessment - 02/04/18 1401    Subjective  I the ionto patch has helped. I was sore after the dry needling.     Patient Stated Goals  Get shoulder back to normal    Currently in Pain?  Yes    Pain Score  3     Pain Location  Shoulder    Pain Orientation  Left    Pain Descriptors / Indicators  Aching    Pain Type  Acute pain    Pain Onset  More than a month ago    Pain Frequency  Intermittent    Aggravating Factors   sleep on left side    Pain  Relieving Factors  arm at side    Multiple Pain Sites  No                       [facility] Adult PT Treatment/Exercise - 02/04/18 0001      Shoulder Exercises: Supine   Protraction  Strengthening;Left;10 reps   10x2 with 1#   Protraction Limitations  then hold weight moving in circles 10x each way, move up and down and side to side     Horizontal ABduction  Strengthening;Left;10 reps   within painfree range   Other Supine Exercises  supine move left arm in Y movement within painfree range then diagonally at patient side 10x      Shoulder Exercises: Sidelying   External Rotation  Strengthening;Right;10 reps    External Rotation Limitations  arm at side with therapist guiding scapula      Shoulder Exercises: Standing   Other Standing Exercises  UE ranger for shoulder flexion at different levels for range, abduction to 90 degrees with therapist guiding scapula    Other Standing Exercises  stand at high low table and press hand into ball and move  forward and back, side to side, circles to work shoulder depressors      Modalities   Modalities  Iontophoresis      Iontophoresis   Type of Iontophoresis  Dexamethasone    Location  left RTC tendon    Dose  1 ml, #2    Time  6 hour patch      Manual Therapy   Manual Therapy  Soft tissue mobilization    Soft tissue mobilization  left interscapular, left pectoralis, left upper trap and levator scapula             PT Education - 02/04/18 1434    Education Details  Access Code: [email or web address]      Person(s) Educated  Patient    Methods  Explanation;Demonstration;Verbal cues;Handout    Comprehension  Returned demonstration;Verbalized understanding       PT Short Term Goals - 01/28/18 1457      PT SHORT TERM GOAL #1   Title  independent with initial HEP    Status  On-going      PT SHORT TERM GOAL #3   Title  left shoulder A/ROM for abduction >/= 130 degrees due to less pain and improved mobility    Status  On-going    able to reach 130+ in scaption today but not abduction secondary to pain       PT Long Term Goals - 01/25/18 1130      PT LONG TERM GOAL #1   Title  Patient independent with HEP and understand how to progress himself    Time  8    Period  Weeks    Target Date  03/22/18      PT LONG TERM GOAL #2   Title  reach into a coat or putting on a sweater with pain level decreased >/= 80%    Time  8    Period  Weeks    Status  New    Target Date  03/22/18      PT LONG TERM GOAL #3   Title  ability to reach out to the side at 90 degrees abduction to place ticket in a garage with pain level decreased >/= 80%    Time  8    Period  Weeks    Target Date  03/22/18      PT LONG TERM GOAL #4   Title  reach in an overhead shelf to get a plate due to left shoulder flexion A/ROM >/= 145 degrees with minimal pain and strength >/= 4/5    Time  8    Period  Weeks    Status  New    Target Date  03/22/18      PT LONG TERM GOAL #5   Title  sleep on left side with minmal to no pain due to reduction of inflammation    Time  8    Period  Weeks    Status  New    Target Date  03/22/18            Plan - 02/04/18 1427    Clinical Impression Statement  Patient has increased ROM of left shoulder abduction in sidely without pain.  Patient has difficulty with scapula mobility with shoulder mobility. Patient has reduction of  trigger points . Patient will benefit from skilled therapy to improve ROM and strength of left shoulder     Rehab Potential  Excellent    Clinical Impairments Affecting Rehab Potential  none    PT Frequency  2x / week    PT Duration  8 weeks    PT Treatment/Interventions  Cryotherapy;Electrical Stimulation;Iontophoresis 4mg /ml Dexamethasone;Moist Heat;Ultrasound;Therapeutic activities;Therapeutic exercise;Neuromuscular re-education;Manual techniques;Patient/family education;Passive range of motion;Dry needling;Taping;Joint Manipulations    PT Next Visit Plan  , left shoulder  mobilization,  scapula retraction;  shoulder ranger;  work on opening up the left chest wall    PT Home Exercise Plan  Access Code: [email or web address]      Consulted and Agree with Plan of Care  Patient       Patient will benefit from skilled therapeutic intervention in order to improve the following deficits and impairments:  Increased fascial restricitons, Pain, Decreased coordination, Decreased mobility, Increased muscle spasms, Decreased activity tolerance, Decreased endurance, Decreased range of motion, Decreased strength  Visit Diagnosis: Acute pain of left shoulder  Stiffness of left shoulder, not elsewhere classified  Muscle weakness (generalized)     Problem List Patient Active Problem List   Diagnosis Date Noted  . OA (osteoarthritis) of hip 04/05/2016    Earlie Counts, PT 02/04/18 2:48 PM   [facility] [address] [place], [place], [address] Phone: [phone]   Fax:  [phone]  Name: LAIYAH EXLINE MRN: [id number] Date of Birth: July 08, 1945

## 2018-02-08 ENCOUNTER — Ambulatory Visit: Payer: Medicare HMO | Admitting: Physical Therapy

## 2018-02-08 DIAGNOSIS — M25512 Pain in left shoulder: Secondary | ICD-10-CM

## 2018-02-08 DIAGNOSIS — M6281 Muscle weakness (generalized): Secondary | ICD-10-CM | POA: Diagnosis not present

## 2018-02-08 DIAGNOSIS — M25612 Stiffness of left shoulder, not elsewhere classified: Secondary | ICD-10-CM

## 2018-02-08 NOTE — Therapy (Signed)
[facility] [address] [place], [place], [address] Phone: [phone]   Fax:  [phone]  Physical Therapy Treatment  Patient Details  Name: Marisa Holden MRN: [id number] Date of Birth: [birth year] Referring Provider (PT): Dr. Berle Mull   Encounter Date: 02/08/2018  PT End of Session - 02/08/18 1622    Visit Number  5    Date for PT Re-Evaluation  03/22/18    Authorization Type  [employer] Medicare    PT Start Time  1100    PT Stop Time  1146    PT Time Calculation (min)  46 min    Activity Tolerance  Patient tolerated treatment well;No increased pain    Behavior During Therapy  WFL for tasks assessed/performed       Past Medical History:  Diagnosis Date  . Anxiety    due to surgery  . Arthritis   . Dysrhythmia    heart palpitaions  . Hypertension   . Pre-diabetes    diet controlled    Past Surgical History:  Procedure Laterality Date  . cataract surgery     bilateral with lens implant  . COLONOSCOPY WITH PROPOFOL N/A 04/07/2014   Procedure: COLONOSCOPY WITH PROPOFOL;  Surgeon: Garlan Fair, MD;  Location: [facility];  Service: Endoscopy;  Laterality: N/A;  . EYE SURGERY     left eye macular hole  . TOTAL HIP ARTHROPLASTY Right 04/05/2016   Procedure: RIGHT TOTAL HIP ARTHROPLASTY ANTERIOR APPROACH;  Surgeon: Gaynelle Arabian, MD;  Location: [facility];  Service: Orthopedics;  Laterality: Right;    There were no vitals filed for this visit.  Subjective Assessment - 02/08/18 1617    Subjective  The ionto patch helped, PT is helping me get more ROM    Currently in Pain?  Yes    Pain Score  4     Pain Location  Shoulder    Pain Orientation  Left    Pain Descriptors / Indicators  Aching    Pain Type  Acute pain    Pain Onset  More than a month ago    Pain Frequency  Intermittent                       [facility] Adult PT Treatment/Exercise - 02/08/18 0001      Shoulder Exercises: Supine    Protraction  Strengthening;Left;10 reps   1 lb 2X10   Protraction Limitations  then hold weight moving in circles 10x each way, move up and down and side to side     Horizontal ABduction  15 reps    Flexion  Strengthening;Left;20 reps    Shoulder Flexion Weight (lbs)  1    Other Supine Exercises  supine move left arm in Y movement within painfree range then diagonally at patient side 10x      Shoulder Exercises: Sidelying   External Rotation  15 reps    External Rotation Limitations  arm at side with therapist guiding scapula      Shoulder Exercises: Standing   Extension  20 reps    Theraband Level (Shoulder Extension)  Level 2 (Red)    Row  20 reps    Theraband Level (Shoulder Row)  Level 2 (Red)    Other Standing Exercises  UE ranger for shoulder flexion at different levels for range, abduction to 90 degrees with therapist guiding scapula    Other Standing Exercises  ball on wall at 90 deg circles CW,  CCW, up/down, left to right X 20 ea      Shoulder Exercises: Pulleys   Flexion  2 minutes    ABduction  2 minutes      Shoulder Exercises: Stretch   Other Shoulder Stretches  doorway chest/anterior shoulder stretch 3x20 sec      Modalities   Modalities  Iontophoresis      Iontophoresis   Type of Iontophoresis  Dexamethasone    Location  left RTC tendon    Dose  1 ml, #3    Time  6 hour patch      Manual Therapy   Joint Mobilization  Lt GH joint distraction in supine Gr II, post glide and inferior glide Gr II/III    Soft tissue mobilization  left interscapular, left pectoralis, left upper trap and levator scapula               PT Short Term Goals - 01/28/18 1457      PT SHORT TERM GOAL #1   Title  independent with initial HEP    Status  On-going      PT SHORT TERM GOAL #3   Title  left shoulder A/ROM for abduction >/= 130 degrees due to less pain and improved mobility    Status  On-going   able to reach 130+ in scaption today but not abduction secondary to  pain       PT Long Term Goals - 01/25/18 1130      PT LONG TERM GOAL #1   Title  Patient independent with HEP and understand how to progress himself    Time  8    Period  Weeks    Target Date  03/22/18      PT LONG TERM GOAL #2   Title  reach into a coat or putting on a sweater with pain level decreased >/= 80%    Time  8    Period  Weeks    Status  New    Target Date  03/22/18      PT LONG TERM GOAL #3   Title  ability to reach out to the side at 90 degrees abduction to place ticket in a garage with pain level decreased >/= 80%    Time  8    Period  Weeks    Target Date  03/22/18      PT LONG TERM GOAL #4   Title  reach in an overhead shelf to get a plate due to left shoulder flexion A/ROM >/= 145 degrees with minimal pain and strength >/= 4/5    Time  8    Period  Weeks    Status  New    Target Date  03/22/18      PT LONG TERM GOAL #5   Title  sleep on left side with minmal to no pain due to reduction of inflammation    Time  8    Period  Weeks    Status  New    Target Date  03/22/18            Plan - 02/08/18 1622    Clinical Impression Statement  Session focused on Lt shoulder ROM and scapular stabilization with good tolerance. MT performed to decrease pain, decrease soft tissue restrictions and decrease pain. IONTO patch again applied as she relays this is helping with pain.     Rehab Potential  Excellent    Clinical Impairments Affecting Rehab Potential  none    PT  Frequency  2x / week    PT Duration  8 weeks    PT Treatment/Interventions  Cryotherapy;Electrical Stimulation;Iontophoresis 4mg /ml Dexamethasone;Moist Heat;Ultrasound;Therapeutic activities;Therapeutic exercise;Neuromuscular re-education;Manual techniques;Patient/family education;Passive range of motion;Dry needling;Taping;Joint Manipulations    PT Next Visit Plan  , left shoulder mobilization,  scapula retraction;  shoulder ranger;  work on opening up the left chest wall    PT Home Exercise  Plan  Access Code: [email or web address]      Consulted and Agree with Plan of Care  Patient       Patient will benefit from skilled therapeutic intervention in order to improve the following deficits and impairments:  Increased fascial restricitons, Pain, Decreased coordination, Decreased mobility, Increased muscle spasms, Decreased activity tolerance, Decreased endurance, Decreased range of motion, Decreased strength  Visit Diagnosis: Acute pain of left shoulder  Stiffness of left shoulder, not elsewhere classified  Muscle weakness (generalized)     Problem List Patient Active Problem List   Diagnosis Date Noted  . OA (osteoarthritis) of hip 04/05/2016    Marisa Holden 02/08/2018, 4:25 PM  [facility] [address] [place], [place], [address] Phone: [phone]   Fax:  [phone]  Name: Marisa Holden MRN: [id number] Date of Birth: [birth year]

## 2018-02-14 ENCOUNTER — Encounter: Payer: Self-pay | Admitting: Physical Therapy

## 2018-02-14 ENCOUNTER — Ambulatory Visit: Payer: Medicare HMO | Attending: Sports Medicine | Admitting: Physical Therapy

## 2018-02-14 DIAGNOSIS — M25612 Stiffness of left shoulder, not elsewhere classified: Secondary | ICD-10-CM | POA: Diagnosis not present

## 2018-02-14 DIAGNOSIS — M6281 Muscle weakness (generalized): Secondary | ICD-10-CM

## 2018-02-14 DIAGNOSIS — M25512 Pain in left shoulder: Secondary | ICD-10-CM | POA: Diagnosis not present

## 2018-02-14 NOTE — Therapy (Signed)
[facility] [address], [place], [place], [address] Phone: [phone]   Fax:  [phone]  Physical Therapy Treatment  Patient Details  Name: Marisa Holden MRN: [id number] Date of Birth: March 05, 1945 Referring Provider (PT): Dr. Berle Mull   Encounter Date: 02/14/2018  PT End of Session - 02/14/18 1444    Visit Number  6    Date for PT Re-Evaluation  03/22/18    Authorization Type  [employer]NA Medicare    PT Start Time  2683    PT Stop Time  1524    PT Time Calculation (min)  40 min    Activity Tolerance  Patient tolerated treatment well;No increased pain    Behavior During Therapy  WFL for tasks assessed/performed       Past Medical History:  Diagnosis Date  . Anxiety    due to surgery  . Arthritis   . Dysrhythmia    heart palpitaions  . Hypertension   . Pre-diabetes    diet controlled    Past Surgical History:  Procedure Laterality Date  . cataract surgery     bilateral with lens implant  . COLONOSCOPY WITH PROPOFOL N/A 04/07/2014   Procedure: COLONOSCOPY WITH PROPOFOL;  Surgeon: Garlan Fair, MD;  [facility];  Service: Endoscopy;  Laterality: N/A;  . EYE SURGERY     left eye macular hole  . TOTAL HIP ARTHROPLASTY Right 04/05/2016   Procedure: RIGHT TOTAL HIP ARTHROPLASTY ANTERIOR APPROACH;  Surgeon: Gaynelle Arabian, MD;  [facility];  Service: Orthopedics;  Laterality: Right;    There were no vitals filed for this visit.  Subjective Assessment - 02/14/18 1447    Subjective  Working the muscles help. The isometric in the doorway hurts and I haven't been doing those. It just hurts to reach out to the side like getting a parking ticket. Denies pain at rest    Patient Stated Goals  Get shoulder back to normal    Currently in Pain?  No/denies                       [facility] Adult PT Treatment/Exercise - 02/14/18 0001      Shoulder Exercises: Supine   Protraction   Strengthening;Left;10 reps   2 lb 2X10   Protraction Limitations  then hold weight moving in circles 10x each way, move up and down and side to side    2lb   Horizontal ABduction  15 reps    External Rotation  Strengthening;Theraband;Left;Right;10 reps   single within pain free ROM   Other Supine Exercises  supine isometric in all direction with PT pulling red band - 3 x 30 sec      Shoulder Exercises: Seated   Other Seated Exercises  AROM flex and scaption to 90 - 10x each      Shoulder Exercises: Standing   Extension  20 reps    Theraband Level (Shoulder Extension)  Level 2 (Red)    Row  20 reps    Theraband Level (Shoulder Row)  Level 2 (Red)    Other Standing Exercises  finger ladder to t    Other Standing Exercises  red ball on wall at 90 deg circles CW, CCW 20x each      Shoulder Exercises: Pulleys   Flexion  2 minutes    ABduction  2 minutes      Shoulder Exercises: ROM/Strengthening   Other ROM/Strengthening Exercises  pec stretch in doorway -  3 x 20 sec      Iontophoresis   Type of Iontophoresis  Dexamethasone    Location  left RTC tendon    Dose  1 ml, #4    Time  6 hour patch      Manual Therapy   Manual Therapy  Soft tissue mobilization               PT Short Term Goals - 02/14/18 1542      PT SHORT TERM GOAL #1   Title  independent with initial HEP    Status  Achieved      PT SHORT TERM GOAL #2   Title  lay on left shoulder with pain decreased >/= 25%    Status  On-going        PT Long Term Goals - 01/25/18 1130      PT LONG TERM GOAL #1   Title  Patient independent with HEP and understand how to progress himself    Time  8    Period  Weeks    Target Date  03/22/18      PT LONG TERM GOAL #2   Title  reach into a coat or putting on a sweater with pain level decreased >/= 80%    Time  8    Period  Weeks    Status  New    Target Date  03/22/18      PT LONG TERM GOAL #3   Title  ability to reach out to the side at 90 degrees  abduction to place ticket in a garage with pain level decreased >/= 80%    Time  8    Period  Weeks    Target Date  03/22/18      PT LONG TERM GOAL #4   Title  reach in an overhead shelf to get a plate due to left shoulder flexion A/ROM >/= 145 degrees with minimal pain and strength >/= 4/5    Time  8    Period  Weeks    Status  New    Target Date  03/22/18      PT LONG TERM GOAL #5   Title  sleep on left side with minmal to no pain due to reduction of inflammation    Time  8    Period  Weeks    Status  New    Target Date  03/22/18            Plan - 02/14/18 1537    Clinical Impression Statement  Pt was able to progress resistance today.  she has some increased pain with certain movements but cued to bring shoulders back slightly and limit moveemnt to pain free range and then tolerated exercises well.  Pt has tirgger points in anterior deltoid and pec minor.  She will continue to benefit from skilled PT to progress shoulder strength    PT Treatment/Interventions  Cryotherapy;Electrical Stimulation;Iontophoresis 4mg /ml Dexamethasone;Moist Heat;Ultrasound;Therapeutic activities;Therapeutic exercise;Neuromuscular re-education;Manual techniques;Patient/family education;Passive range of motion;Dry needling;Taping;Joint Manipulations    PT Next Visit Plan  ionto #5, left shoulder mobilization,  scapula retraction;  shoulder ranger;  work on opening up the left chest wall    PT Home Exercise Plan  Access Code: [email or web address]      Consulted and Agree with Plan of Care  Patient       Patient will benefit from skilled therapeutic intervention in order to improve the following deficits and impairments:  Increased fascial restricitons, Pain, Decreased coordination, Decreased mobility,  Increased muscle spasms, Decreased activity tolerance, Decreased endurance, Decreased range of motion, Decreased strength  Visit Diagnosis: Acute pain of left shoulder  Stiffness of left shoulder, not elsewhere  classified  Muscle weakness (generalized)     Problem List Patient Active Problem List   Diagnosis Date Noted  . OA (osteoarthritis) of hip 04/05/2016    Zannie Cove, PT 02/14/2018, 3:42 PM  [facility] [address] [place], [place], [address] Phone: [phone]   Fax:  [phone]  Name: MICHAELINA BLANDINO MRN: [id number] Date of Birth: [birth year]

## 2018-02-18 DIAGNOSIS — R69 Illness, unspecified: Secondary | ICD-10-CM | POA: Diagnosis not present

## 2018-02-20 ENCOUNTER — Ambulatory Visit: Payer: Medicare HMO | Admitting: Physical Therapy

## 2018-02-22 ENCOUNTER — Ambulatory Visit: Payer: Medicare HMO | Admitting: Physical Therapy

## 2018-02-22 DIAGNOSIS — M25612 Stiffness of left shoulder, not elsewhere classified: Secondary | ICD-10-CM | POA: Diagnosis not present

## 2018-02-22 DIAGNOSIS — M6281 Muscle weakness (generalized): Secondary | ICD-10-CM | POA: Diagnosis not present

## 2018-02-22 DIAGNOSIS — M25512 Pain in left shoulder: Secondary | ICD-10-CM

## 2018-02-22 NOTE — Therapy (Signed)
[facility] [address], [place], [place], [address] Phone: [phone]   Fax:  [phone]  Physical Therapy Treatment  Patient Details  Name: Marisa Holden MRN: [id number] Date of Birth: Oct 04, 1945 Referring Provider (PT): Dr. Berle Mull   Encounter Date: 02/22/2018  PT End of Session - 02/22/18 0938    Visit Number  7    Date for PT Re-Evaluation  03/22/18    Authorization Type  AETNA Medicare    PT Start Time  0932    Activity Tolerance  Patient tolerated treatment well    Behavior During Therapy  [facility] for tasks assessed/performed       Past Medical History:  Diagnosis Date  . Anxiety    due to surgery  . Arthritis   . Dysrhythmia    heart palpitaions  . Hypertension   . Pre-diabetes    diet controlled    Past Surgical History:  Procedure Laterality Date  . cataract surgery     bilateral with lens implant  . COLONOSCOPY WITH PROPOFOL N/A 04/07/2014   Procedure: COLONOSCOPY WITH PROPOFOL;  Surgeon: Garlan Fair, MD;  [facility];  Service: Endoscopy;  Laterality: N/A;  . EYE SURGERY     left eye macular hole  . TOTAL HIP ARTHROPLASTY Right 04/05/2016   Procedure: RIGHT TOTAL HIP ARTHROPLASTY ANTERIOR APPROACH;  Surgeon: Gaynelle Arabian, MD;  [facility];  Service: Orthopedics;  Laterality: Right;    There were no vitals filed for this visit.  Subjective Assessment - 02/22/18 0934    Subjective  Pt states she has been worse since previous visit.  States reaching to the side and at night is the worst.      Currently in Pain?  No/denies    Pain Score  5     Pain Location  Shoulder    Pain Orientation  Left    Pain Descriptors / Indicators  Aching    Pain Onset  More than a month ago    Pain Frequency  Intermittent    Aggravating Factors   reaching to the side hurts    Multiple Pain Sites  No                       [facility] Adult PT Treatment/Exercise - 02/22/18  0001      Shoulder Exercises: Standing   External Rotation  Strengthening;Both;10 reps;Theraband   5 x red; 5 x yellow - stopped due to pain   Other Standing Exercises  UE ranger level 30 FF - 30x    Other Standing Exercises  pball roll up the wall - cues for scap stability      Manual Therapy   Manual Therapy  Joint mobilization;Soft tissue mobilization;Taping    Joint Mobilization  Lt GH joint distraction in supine Gr II, post glide and inferior glide Gr II/III    Soft tissue mobilization  left infraspinatus, supraspinus, left pectoralis, left upper trap and levator scapula    Kinesiotex  Facilitate Muscle      Kinesiotix   Facilitate Muscle   lateral delt supra and infraspinatus               PT Short Term Goals - 02/14/18 1542      PT SHORT TERM GOAL #1   Title  independent with initial HEP    Status  Achieved      PT SHORT TERM GOAL #2   Title  lay  on left shoulder with pain decreased >/= 25%    Status  On-going        PT Long Term Goals - 01/25/18 1130      PT LONG TERM GOAL #1   Title  Patient independent with HEP and understand how to progress himself    Time  8    Period  Weeks    Target Date  03/22/18      PT LONG TERM GOAL #2   Title  reach into a coat or putting on a sweater with pain level decreased >/= 80%    Time  8    Period  Weeks    Status  New    Target Date  03/22/18      PT LONG TERM GOAL #3   Title  ability to reach out to the side at 90 degrees abduction to place ticket in a garage with pain level decreased >/= 80%    Time  8    Period  Weeks    Target Date  03/22/18      PT LONG TERM GOAL #4   Title  reach in an overhead shelf to get a plate due to left shoulder flexion A/ROM >/= 145 degrees with minimal pain and strength >/= 4/5    Time  8    Period  Weeks    Status  New    Target Date  03/22/18      PT LONG TERM GOAL #5   Title  sleep on left side with minmal to no pain due to reduction of inflammation    Time  8     Period  Weeks    Status  New    Target Date  03/22/18            Plan - 02/22/18 1018    Clinical Impression Statement  Pt did not tolerate as much strengthening today due to increased soreness.  Pt was inflamed from the ionto and the pec stretches in the doorway last time.  Pt did well with manual and taping.  Will re-assess response and progress towards goals next time    PT Treatment/Interventions  Cryotherapy;Electrical Stimulation;Iontophoresis 4mg /ml Dexamethasone;Moist Heat;Ultrasound;Therapeutic activities;Therapeutic exercise;Neuromuscular re-education;Manual techniques;Patient/family education;Passive range of motion;Dry needling;Taping;Joint Manipulations    PT Next Visit Plan  f/u on response to manual and goals, increasing RTC strength as tolerate STM and taping if needed    PT Home Exercise Plan  Access Code: [email or web address]      Consulted and Agree with Plan of Care  Patient       Patient will benefit from skilled therapeutic intervention in order to improve the following deficits and impairments:  Increased fascial restricitons, Pain, Decreased coordination, Decreased mobility, Increased muscle spasms, Decreased activity tolerance, Decreased endurance, Decreased range of motion, Decreased strength  Visit Diagnosis: No diagnosis found.     Problem List Patient Active Problem List   Diagnosis Date Noted  . OA (osteoarthritis) of hip 04/05/2016    Zannie Cove, PT 02/22/2018, 10:44 AM  [facility] [address] [place], [place], [address] Phone: [phone]   Fax:  [phone]  Name: Marisa Holden MRN: [id number] Date of Birth: July 13, 1945

## 2018-02-28 ENCOUNTER — Encounter: Payer: Self-pay | Admitting: Physical Therapy

## 2018-02-28 ENCOUNTER — Ambulatory Visit: Payer: Medicare HMO | Admitting: Physical Therapy

## 2018-02-28 DIAGNOSIS — M25512 Pain in left shoulder: Secondary | ICD-10-CM

## 2018-02-28 DIAGNOSIS — M6281 Muscle weakness (generalized): Secondary | ICD-10-CM | POA: Diagnosis not present

## 2018-02-28 DIAGNOSIS — M25612 Stiffness of left shoulder, not elsewhere classified: Secondary | ICD-10-CM | POA: Diagnosis not present

## 2018-02-28 NOTE — Therapy (Signed)
[facility] [address], [place], [place], [address] Phone: [phone]   Fax:  [phone]  Physical Therapy Treatment  Patient Details  Name: Marisa Holden MRN: [id number] Date of Birth: [birth year] Referring Provider (PT): Dr. Berle Mull   Encounter Date: 02/28/2018  PT End of Session - 02/28/18 1357    Visit Number  8    Date for PT Re-Evaluation  03/22/18    Authorization Type  AETNA Medicare    PT Start Time  0962    PT Stop Time  1437    PT Time Calculation (min)  40 min    Activity Tolerance  Patient tolerated treatment well    Behavior During Therapy  [facility] for tasks assessed/performed       Past Medical History:  Diagnosis Date  . Anxiety    due to surgery  . Arthritis   . Dysrhythmia    heart palpitaions  . Hypertension   . Pre-diabetes    diet controlled    Past Surgical History:  Procedure Laterality Date  . cataract surgery     bilateral with lens implant  . COLONOSCOPY WITH PROPOFOL N/A 04/07/2014   Procedure: COLONOSCOPY WITH PROPOFOL;  Surgeon: Garlan Fair, MD;  [facility];  Service: Endoscopy;  Laterality: N/A;  . EYE SURGERY     left eye macular hole  . TOTAL HIP ARTHROPLASTY Right 04/05/2016   Procedure: RIGHT TOTAL HIP ARTHROPLASTY ANTERIOR APPROACH;  Surgeon: Gaynelle Arabian, MD;  [facility];  Service: Orthopedics;  Laterality: Right;    There were no vitals filed for this visit.  Subjective Assessment - 02/28/18 1400    Subjective  Pt states she is still having soreness but is sleeping on it a little better    Patient Stated Goals  Get shoulder back to normal    Currently in Pain?  No/denies         [facility] PT Assessment - 02/28/18 0001      PROM   Right Shoulder External Rotation  70 Degrees   Left                  [facility] Adult PT Treatment/Exercise - 02/28/18 0001      Shoulder Exercises: Standing   Other Standing Exercises  UE ranger  level 30 FF and  - 30x    Other Standing Exercises  pball roll up the wall - cues for scap stability      Shoulder Exercises: Pulleys   Flexion  2 minutes      Shoulder Exercises: Isometric Strengthening   External Rotation  --   standing side step with resistance yellow     Manual Therapy   Joint Mobilization  Lt GH joint distraction in supine Gr II, post glide and inferior glide Gr II/III    Soft tissue mobilization  left infraspinatus, supraspinus, left pectoralis, left upper trap and levator scapula    Kinesiotex  Facilitate Muscle      Kinesiotix   Facilitate Muscle   lateral delt supra and infraspinatus               PT Short Term Goals - 02/14/18 1542      PT SHORT TERM GOAL #1   Title  independent with initial HEP    Status  Achieved      PT SHORT TERM GOAL #2   Title  lay on left shoulder with pain decreased >/= 25%    Status  On-going        PT Long Term Goals - 01/25/18 1130      PT LONG TERM GOAL #1   Title  Patient independent with HEP and understand how to progress himself    Time  8    Period  Weeks    Target Date  03/22/18      PT LONG TERM GOAL #2   Title  reach into a coat or putting on a sweater with pain level decreased >/= 80%    Time  8    Period  Weeks    Status  New    Target Date  03/22/18      PT LONG TERM GOAL #3   Title  ability to reach out to the side at 90 degrees abduction to place ticket in a garage with pain level decreased >/= 80%    Time  8    Period  Weeks    Target Date  03/22/18      PT LONG TERM GOAL #4   Title  reach in an overhead shelf to get a plate due to left shoulder flexion A/ROM >/= 145 degrees with minimal pain and strength >/= 4/5    Time  8    Period  Weeks    Status  New    Target Date  03/22/18      PT LONG TERM GOAL #5   Title  sleep on left side with minmal to no pain due to reduction of inflammation    Time  8    Period  Weeks    Status  New    Target Date  03/22/18             Plan - 02/28/18 1527    Clinical Impression Statement  Pt is making progress and reports she can lie on her side more for improved sleep.  Pt responds very well to manual therapy to loosen muscle tension and then [facility] joint mobs for improved ROM.  Pt has limited shoulder abduction but is improved to 70 degrees from 65.  Pt will continue to benefit from skilled PT to address ROM and strength.    PT Treatment/Interventions  Cryotherapy;Electrical Stimulation;Iontophoresis 4mg /ml Dexamethasone;Moist Heat;Ultrasound;Therapeutic activities;Therapeutic exercise;Neuromuscular re-education;Manual techniques;Patient/family education;Passive range of motion;Dry needling;Taping;Joint Manipulations    PT Next Visit Plan  progress RTC strength, f/u on ER in sidelying continue with band, abduction AAROM and AROM    PT Home Exercise Plan  Access Code: [email or web address]      Consulted and Agree with Plan of Care  Patient       Patient will benefit from skilled therapeutic intervention in order to improve the following deficits and impairments:  Increased fascial restricitons, Pain, Decreased coordination, Decreased mobility, Increased muscle spasms, Decreased activity tolerance, Decreased endurance, Decreased range of motion, Decreased strength  Visit Diagnosis: Acute pain of left shoulder  Stiffness of left shoulder, not elsewhere classified  Muscle weakness (generalized)     Problem List Patient Active Problem List   Diagnosis Date Noted  . OA (osteoarthritis) of hip 04/05/2016    Zannie Cove, PT 02/28/2018, 3:58 PM  [facility] [address], [place], [place], [address] Phone: [phone]   Fax:  [phone]  Name: Marisa Holden MRN: [id number] Date of Birth: [birth year]

## 2018-03-12 ENCOUNTER — Encounter: Payer: Self-pay | Admitting: Physical Therapy

## 2018-03-12 ENCOUNTER — Ambulatory Visit: Payer: Medicare HMO | Attending: Sports Medicine | Admitting: Physical Therapy

## 2018-03-12 DIAGNOSIS — M6281 Muscle weakness (generalized): Secondary | ICD-10-CM

## 2018-03-12 DIAGNOSIS — M25512 Pain in left shoulder: Secondary | ICD-10-CM | POA: Insufficient documentation

## 2018-03-12 DIAGNOSIS — M25612 Stiffness of left shoulder, not elsewhere classified: Secondary | ICD-10-CM | POA: Diagnosis not present

## 2018-03-12 NOTE — Therapy (Addendum)
[facility] [address], [place], [place], [address] Phone: [phone]   Fax:  [phone]  Physical Therapy Treatment  Patient Details  Name: Marisa Holden MRN: [id number] Date of Birth: 08-05-1945 Referring Provider (PT): Dr. Berle Mull   Encounter Date: 03/12/2018  PT End of Session - 03/12/18 1612    Visit Number  9    Date for PT Re-Evaluation  03/22/18    Authorization Type  [employer]    PT Start Time  7209    PT Stop Time  1612    PT Time Calculation (min)  43 min    Activity Tolerance  Patient tolerated treatment well    Behavior During Therapy  [facility] for tasks assessed/performed       Past Medical History:  Diagnosis Date  . Anxiety    due to surgery  . Arthritis   . Dysrhythmia    heart palpitaions  . Hypertension   . Pre-diabetes    diet controlled    Past Surgical History:  Procedure Laterality Date  . cataract surgery     bilateral with lens implant  . COLONOSCOPY WITH PROPOFOL N/A 04/07/2014   Procedure: COLONOSCOPY WITH PROPOFOL;  Surgeon: Garlan Fair, MD;  Location: [facility];  Service: Endoscopy;  Laterality: N/A;  . EYE SURGERY     left eye macular hole  . TOTAL HIP ARTHROPLASTY Right 04/05/2016   Procedure: RIGHT TOTAL HIP ARTHROPLASTY ANTERIOR APPROACH;  Surgeon: Gaynelle Arabian, MD;  Location: [facility];  Service: Orthopedics;  Laterality: Right;    There were no vitals filed for this visit.  Subjective Assessment - 03/12/18 1532    Subjective  Pt states there is no pain normally but still reaching and sleeping on it.    Pertinent History  pt goes by "[name]"    Patient Stated Goals  Get shoulder back to normal    Currently in Pain?  No/denies         [facility] PT Assessment - 03/12/18 0001      AROM   Right/Left Shoulder  Left    Right Shoulder ABduction  --    Left Shoulder Flexion  165 Degrees    Left Shoulder ABduction  155 Degrees                    [facility] Adult PT Treatment/Exercise - 03/12/18 0001      Self-Care   Self-Care  Other Self-Care Comments    Other Self-Care Comments   discussed HEP      Shoulder Exercises: Pulleys   Flexion  2 minutes      Shoulder Exercises: ROM/Strengthening   UBE (Upper Arm Bike)  L0 x 4 min with cues for posture    Other ROM/Strengthening Exercises  finger ladder abduction      Manual Therapy   Joint Mobilization  Lt GH joint distraction in supine Gr II, post glide and inferior glide Gr II/III    Soft tissue mobilization  left infraspinatus, supraspinus, left pectoralis, left upper trap and levator scapula    Kinesiotex  Facilitate Muscle      Kinesiotix   Facilitate Muscle   lateral delt supra and infraspinatus               PT Short Term Goals - 03/12/18 1539      PT SHORT TERM GOAL #2   Title  lay on left shoulder with pain decreased >/= 25%    Baseline  40%    Status  Achieved        PT Long Term Goals - 03/12/18 1541      PT LONG TERM GOAL #1   Title  Patient independent with HEP and understand how to progress himself    Status  On-going      PT LONG TERM GOAL #2   Title  reach into a coat or putting on a sweater with pain level decreased >/= 80%    Baseline  same    Status  On-going      PT LONG TERM GOAL #3   Title  ability to reach out to the side at 90 degrees abduction to place ticket in a garage with pain level decreased >/= 80%    Baseline  still 20-30% better    Status  On-going      PT LONG TERM GOAL #4   Title  reach in an overhead shelf to get a plate due to left shoulder flexion A/ROM >/= 145 degrees with minimal pain and strength >/= 4/5    Baseline  145, no pain reaching into cabinet, 4/5    Status  On-going      PT LONG TERM GOAL #5   Title  sleep on left side with minmal to no pain due to reduction of inflammation    Baseline  still hurts, but 40% improved    Status  On-going            Plan - 03/12/18 1717     Clinical Impression Statement  Pt is doing much better.  She has met FOTO goal and is 30% limited currently.  She continues to have difficulty especially with getting sweater on, sleeping, and reaching parking ticket.  Pt has demonstrated improved ROM and met goal for abduction.  Pt continues to have muscle spasms in pecs and deltoids.  Pt will benefit from skilled PT to ensure succesful transition to HEP so she can continue to progress strength for full return to functional activities.    PT Treatment/Interventions  Cryotherapy;Electrical Stimulation;Iontophoresis '4mg'$ /ml Dexamethasone;Moist Heat;Ultrasound;Therapeutic activities;Therapeutic exercise;Neuromuscular re-education;Manual techniques;Patient/family education;Passive range of motion;Dry needling;Taping;Joint Manipulations    PT Next Visit Plan  final HEP, progress RTC strength, f/u on ER in sidelying continue with band, abduction AAROM and AROM    PT Home Exercise Plan  Access Code: [email or web address]      Consulted and Agree with Plan of Care  Patient       Patient will benefit from skilled therapeutic intervention in order to improve the following deficits and impairments:  Increased fascial restricitons, Pain, Decreased coordination, Decreased mobility, Increased muscle spasms, Decreased activity tolerance, Decreased endurance, Decreased range of motion, Decreased strength  Visit Diagnosis: Acute pain of left shoulder  Stiffness of left shoulder, not elsewhere classified  Muscle weakness (generalized)     Problem List Patient Active Problem List   Diagnosis Date Noted  . OA (osteoarthritis) of hip 04/05/2016    Jule Ser, PT 03/12/2018, 5:25 PM  [facility] [address], [place], [place], [address] Phone: [phone]   Fax:  [phone]  Name: Marisa Holden MRN: [id number] Date of Birth: Feb 19, 1945  PHYSICAL THERAPY DISCHARGE SUMMARY  Visits from Start of  Care: 9  Current functional level related to goals / functional outcomes: See above goals   Remaining deficits: See above   Education / Equipment: HEP  Plan: Patient agrees to discharge.  Patient goals were not met. Patient is  being discharged due to not returning since the last visit.  ?????     [employer], PT 05/15/18 1:24 PM

## 2018-03-25 ENCOUNTER — Ambulatory Visit: Payer: Medicare HMO | Admitting: Physical Therapy

## 2018-06-21 IMAGING — MG 2D DIGITAL SCREENING BILATERAL MAMMOGRAM WITH CAD AND ADJUNCT TO
9 of 12 series · 9 of 28 positions shown · non-contrast
Comparison: Previous exam(s).

CLINICAL DATA: Screening.

EXAM:
2D DIGITAL SCREENING BILATERAL MAMMOGRAM WITH CAD AND ADJUNCT TOMO

[R MLO synth-2D]
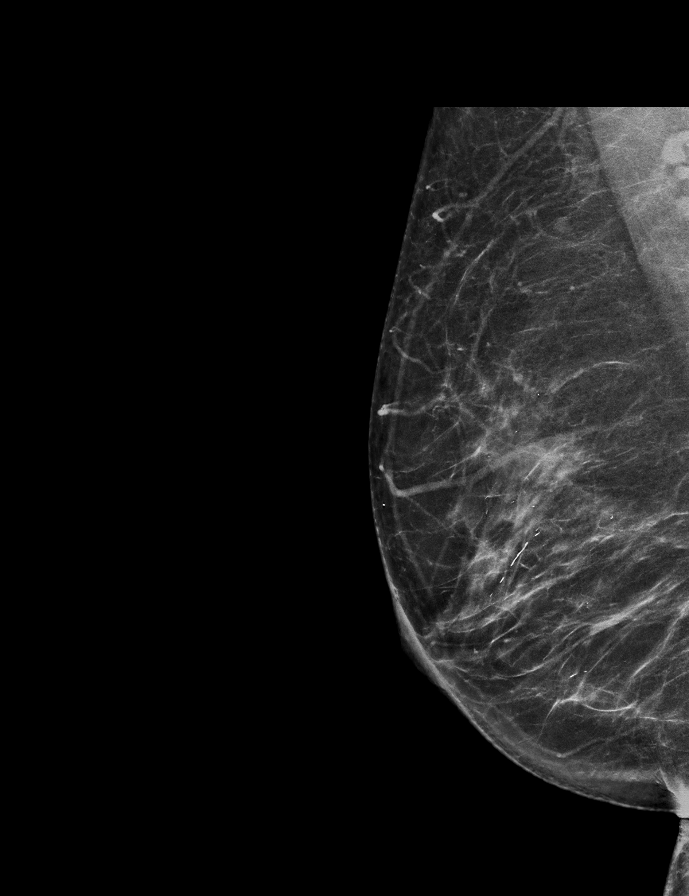

[R MLO]
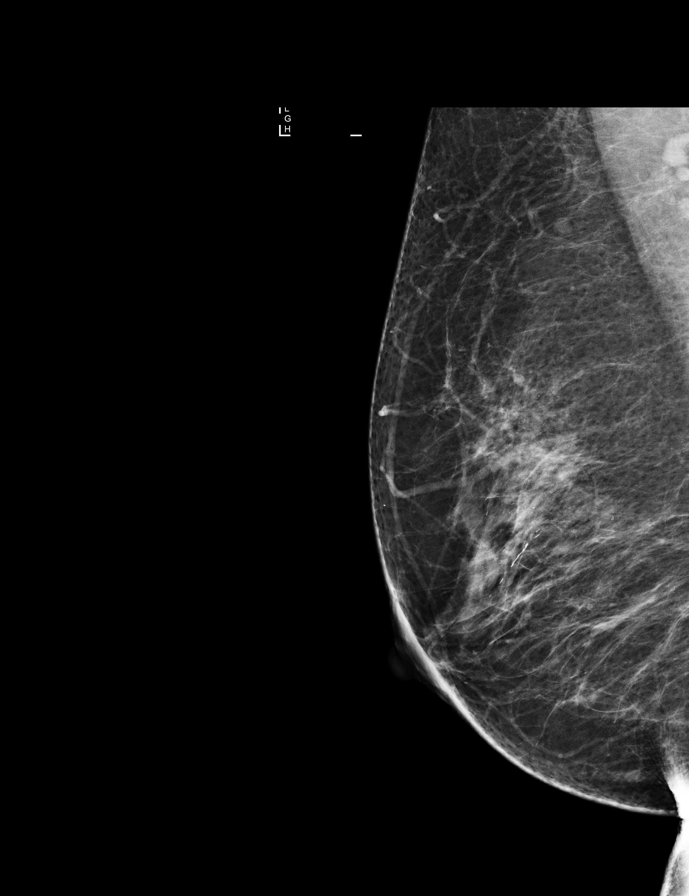

[R CC synth-2D]
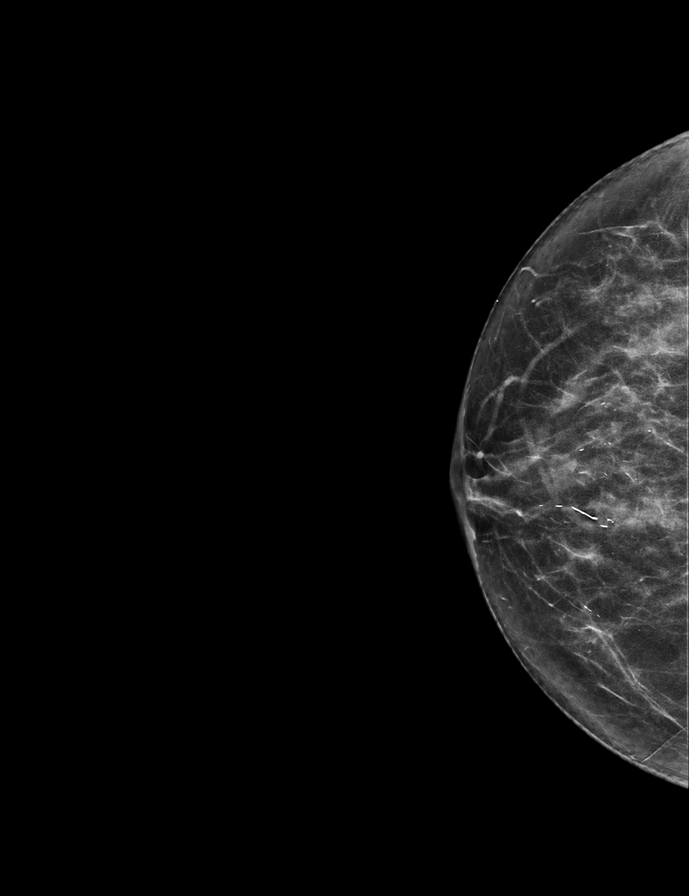

[L MLO]
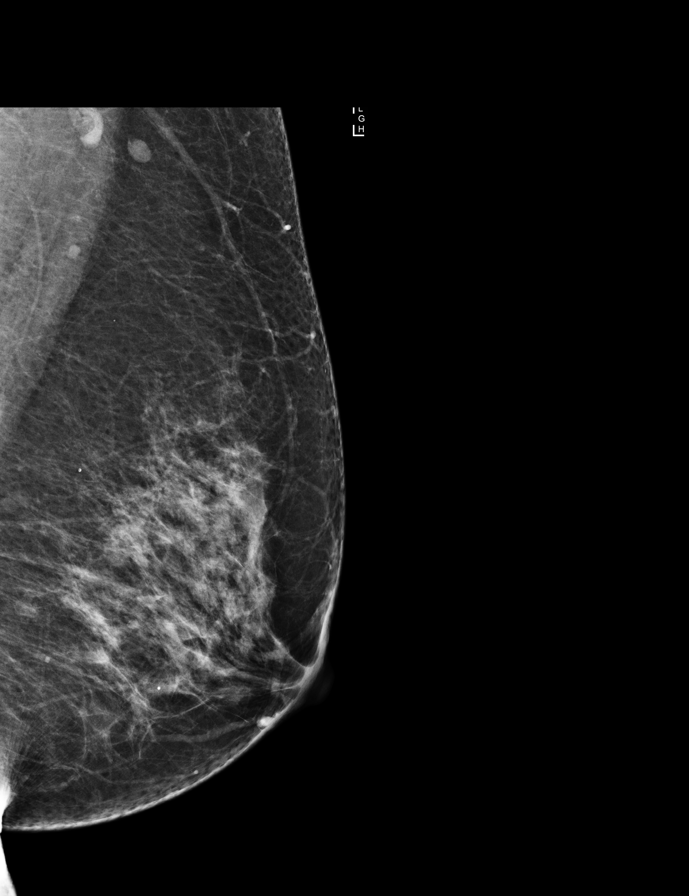

[R CC]
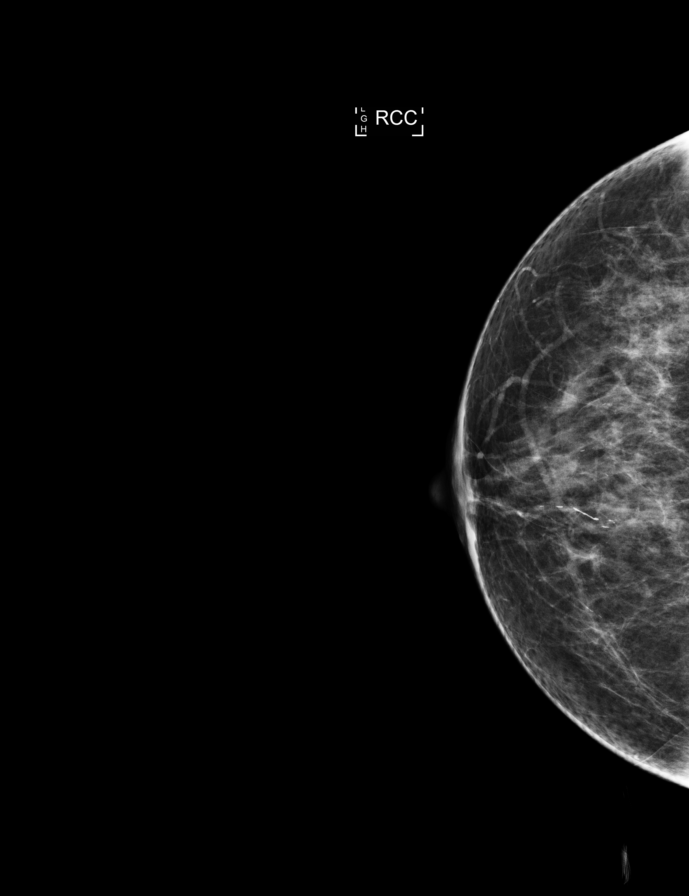

[L MLO synth-2D]
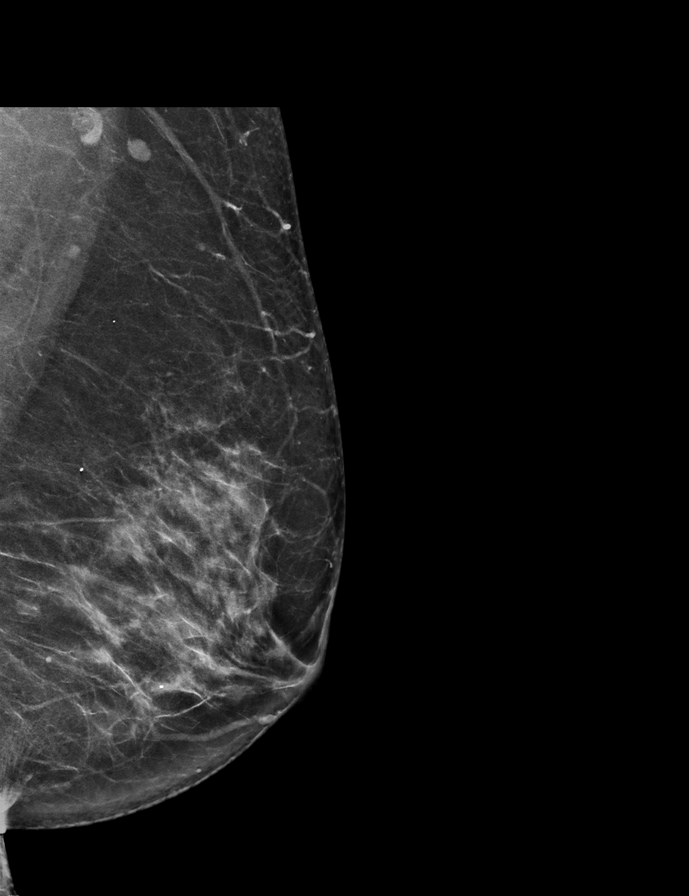

[L CC]
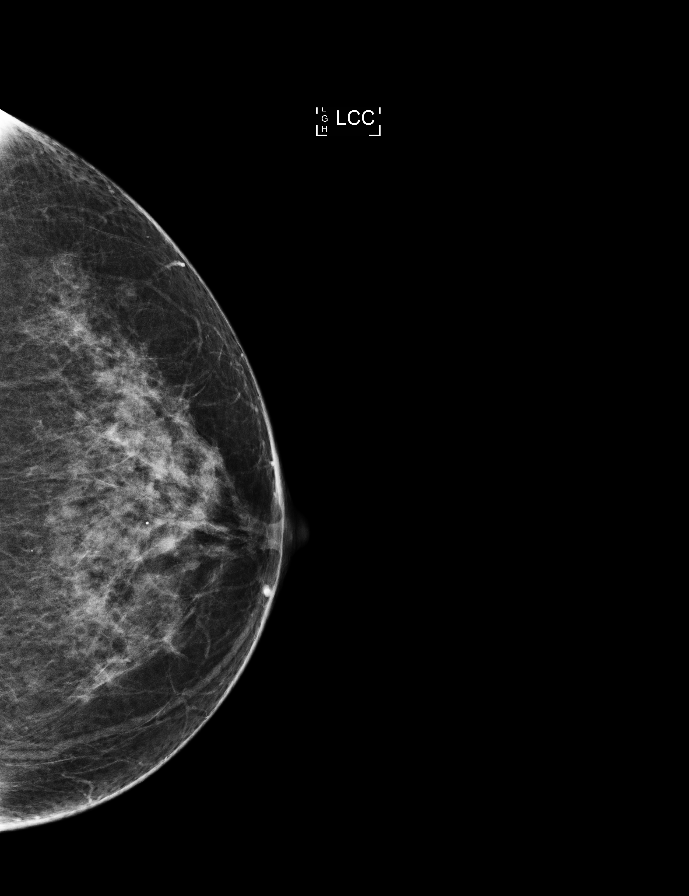

[L CC synth-2D]
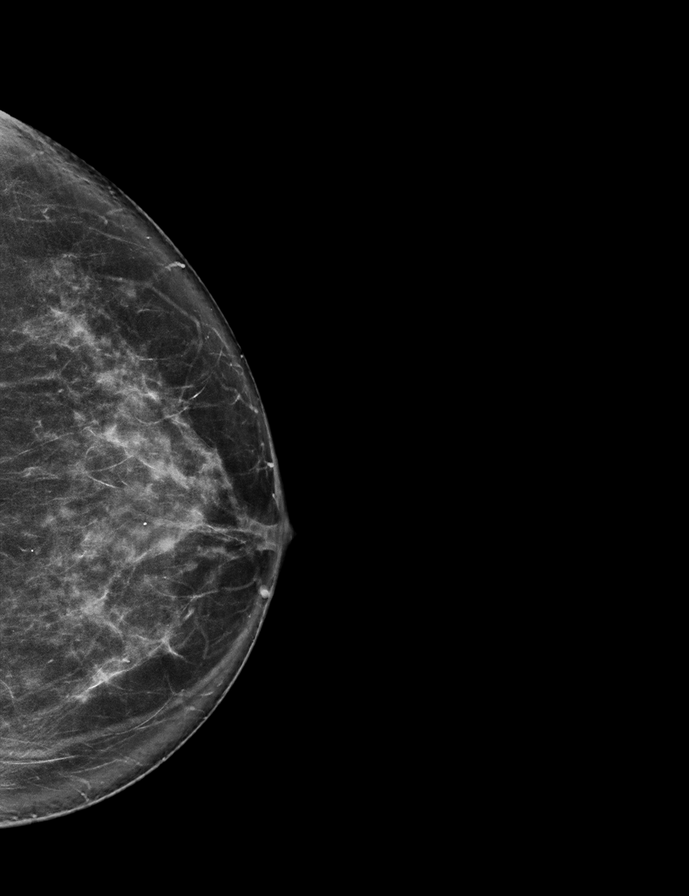

[R CC tomo · tomo slice 31/62.0]
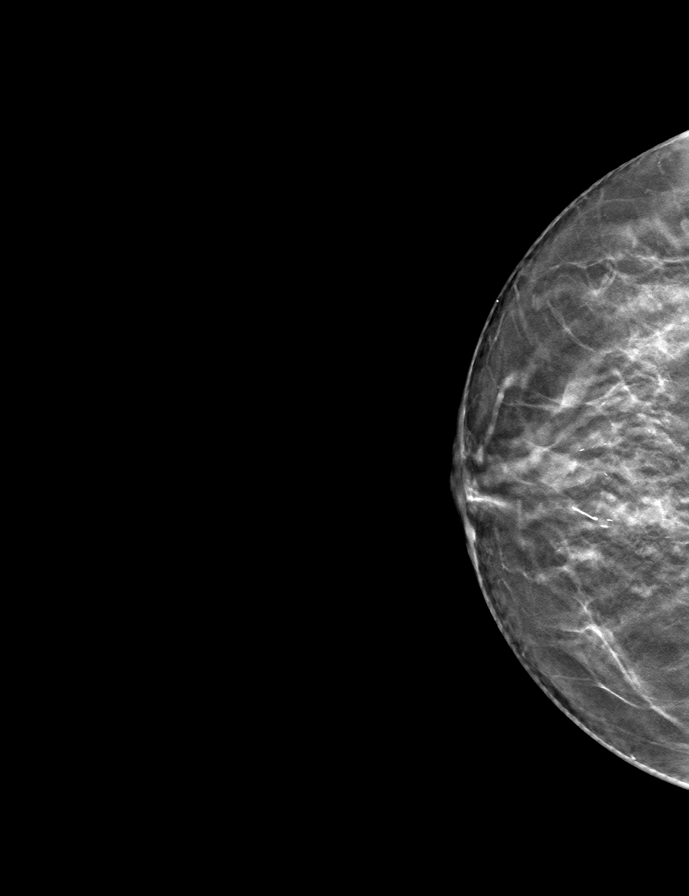

[9 of 28 positions shown; findings below may reference images not displayed]

ACR Breast Density Category c: The breast tissue is heterogeneously
dense, which may obscure small masses.
FINDINGS: There are no findings suspicious for malignancy. Images were
processed with CAD.
IMPRESSION: No mammographic evidence of malignancy. A result letter of this
screening mammogram will be mailed directly to the patient.

RECOMMENDATION:
Screening mammogram in one year. (Code:TN-[id number])

BI-RADS CATEGORY  1: Negative.

## 2018-08-14 DIAGNOSIS — I10 Essential (primary) hypertension: Secondary | ICD-10-CM | POA: Diagnosis not present

## 2018-08-14 DIAGNOSIS — D692 Other nonthrombocytopenic purpura: Secondary | ICD-10-CM | POA: Diagnosis not present

## 2018-08-14 DIAGNOSIS — E782 Mixed hyperlipidemia: Secondary | ICD-10-CM | POA: Diagnosis not present

## 2018-08-14 DIAGNOSIS — E559 Vitamin D deficiency, unspecified: Secondary | ICD-10-CM | POA: Diagnosis not present

## 2018-08-14 DIAGNOSIS — R69 Illness, unspecified: Secondary | ICD-10-CM | POA: Diagnosis not present

## 2018-08-14 DIAGNOSIS — Z79899 Other long term (current) drug therapy: Secondary | ICD-10-CM | POA: Diagnosis not present

## 2018-08-14 DIAGNOSIS — Z0000 Encounter for general adult medical examination without abnormal findings: Secondary | ICD-10-CM | POA: Diagnosis not present

## 2018-08-14 DIAGNOSIS — R7303 Prediabetes: Secondary | ICD-10-CM | POA: Diagnosis not present

## 2018-09-09 DIAGNOSIS — R69 Illness, unspecified: Secondary | ICD-10-CM | POA: Diagnosis not present

## 2018-09-10 ENCOUNTER — Encounter: Payer: Medicare Other | Admitting: Obstetrics & Gynecology

## 2018-09-18 DIAGNOSIS — H401122 Primary open-angle glaucoma, left eye, moderate stage: Secondary | ICD-10-CM | POA: Diagnosis not present

## 2018-09-18 DIAGNOSIS — Z961 Presence of intraocular lens: Secondary | ICD-10-CM | POA: Diagnosis not present

## 2018-11-15 DIAGNOSIS — E559 Vitamin D deficiency, unspecified: Secondary | ICD-10-CM | POA: Diagnosis not present

## 2018-12-09 ENCOUNTER — Encounter: Payer: Medicare Other | Admitting: Obstetrics & Gynecology

## 2019-01-07 ENCOUNTER — Encounter: Payer: Medicare Other | Admitting: Obstetrics & Gynecology

## 2019-03-03 ENCOUNTER — Encounter: Payer: Medicare Other | Admitting: Obstetrics & Gynecology

## 2019-04-18 ENCOUNTER — Encounter: Payer: Medicare Other | Admitting: Obstetrics & Gynecology

## 2019-05-06 ENCOUNTER — Telehealth: Payer: Self-pay

## 2019-05-19 ENCOUNTER — Encounter: Payer: Medicare Other | Admitting: Obstetrics & Gynecology

## 2019-07-08 ENCOUNTER — Other Ambulatory Visit: Payer: Self-pay

## 2019-07-09 ENCOUNTER — Ambulatory Visit (INDEPENDENT_AMBULATORY_CARE_PROVIDER_SITE_OTHER): Payer: Medicare Other | Admitting: Obstetrics & Gynecology

## 2019-07-09 ENCOUNTER — Encounter: Payer: Self-pay | Admitting: Obstetrics & Gynecology

## 2019-07-09 VITALS — BP 128/80 | Ht 63.0 in | Wt 136.0 lb

## 2019-07-09 DIAGNOSIS — Z13820 Encounter for screening for osteoporosis: Secondary | ICD-10-CM | POA: Diagnosis not present

## 2019-07-09 DIAGNOSIS — Z780 Asymptomatic menopausal state: Secondary | ICD-10-CM | POA: Diagnosis not present

## 2019-07-09 DIAGNOSIS — Z01419 Encounter for gynecological examination (general) (routine) without abnormal findings: Secondary | ICD-10-CM

## 2019-07-09 NOTE — Progress Notes (Signed)
MATALYN NAWAZ 1945-09-09 [id number]   History:    74 y.o.  G0 Married.  Enjoys painting and pottery/sculpture.  RP:  Established patient presenting for annual gyn exam   HPI: Postmenopausal, well on no hormone replacement therapy.  No postmenopausal bleeding.  Abstinent.  Complains of intermittent left lower quadrant pain of a crampy nature.  Urine and bowel movements normal.  Breasts normal.  Body mass index 24.09.  Health labs with family physician.   Past medical history,surgical history, family history and social history were all reviewed and documented in the EPIC chart.  Gynecologic History No LMP recorded. Patient is postmenopausal.  Obstetric History OB History  Gravida Para Term Preterm AB Living  0 0 0 0 0 0  SAB TAB Ectopic Multiple Live Births  0 0 0 0 0     ROS: A ROS was performed and pertinent positives and negatives are included in the history.  GENERAL: No fevers or chills. HEENT: No change in vision, no earache, sore throat or sinus congestion. NECK: No pain or stiffness. CARDIOVASCULAR: No chest pain or pressure. No palpitations. PULMONARY: No shortness of breath, cough or wheeze. GASTROINTESTINAL: No abdominal pain, nausea, vomiting or diarrhea, melena or bright red blood per rectum. GENITOURINARY: No urinary frequency, urgency, hesitancy or dysuria. MUSCULOSKELETAL: No joint or muscle pain, no back pain, no recent trauma. DERMATOLOGIC: No rash, no itching, no lesions. ENDOCRINE: No polyuria, polydipsia, no heat or cold intolerance. No recent change in weight. HEMATOLOGICAL: No anemia or easy bruising or bleeding. NEUROLOGIC: No headache, seizures, numbness, tingling or weakness. PSYCHIATRIC: No depression, no loss of interest in normal activity or change in sleep pattern.     Exam:   BP 128/80 (BP Location: Right Arm, Patient Position: Sitting, Cuff Size: Normal)    Ht 5\' 3"  (1.6 m)    Wt 136 lb (61.7 kg)    BMI 24.09 kg/m   Body mass index is 24.09  kg/m.  General appearance : Well developed well nourished female. No acute distress HEENT: Eyes: no retinal hemorrhage or exudates,  Neck supple, trachea midline, no carotid bruits, no thyroidmegaly Lungs: Clear to auscultation, no rhonchi or wheezes, or rib retractions  Heart: Regular rate and rhythm, no murmurs or gallops Breast:Examined in sitting and supine position were symmetrical in appearance, no palpable masses or tenderness,  no skin retraction, no nipple inversion, no nipple discharge, no skin discoloration, no axillary or supraclavicular lymphadenopathy Abdomen: no palpable masses or tenderness, no rebound or guarding Extremities: no edema or skin discoloration or tenderness  Pelvic: Vulva: Normal             Vagina: No gross lesions or discharge  Cervix: No gross lesions or discharge  Uterus  AV, normal size, shape and consistency, non-tender and mobile  Adnexa  Without masses or tenderness  Anus: Normal   Assessment/Plan:  74 y.o. female for annual exam   1. Well female exam with routine gynecological exam Normal gynecologic exam in menopause.  No indication to repeat the Pap test.  Breast exam normal.  Last mammogram 2018, will schedule now.  Colonoscopy 2016.  Health labs with family physician.  Good body mass index at 24.09.  Continue with fitness and healthy nutrition.  2. Post-menopausal Well on no hormone replacement therapy.  No postmenopausal bleeding.  3. Screening for osteoporosis No recent bone density.  Will schedule now.  Vitamin D supplements, calcium intake of 1200 mg daily.  Regular weightbearing physical activity recommended. - DG  Bone Density; Future  Other orders - rosuvastatin (CRESTOR) 5 MG tablet; Take 5 mg by mouth daily.  [name] MD, 2:15 PM 07/09/2019

## 2019-07-12 ENCOUNTER — Encounter: Payer: Self-pay | Admitting: Obstetrics & Gynecology

## 2019-07-12 NOTE — Patient Instructions (Signed)
1. Well female exam with routine gynecological exam Normal gynecologic exam in menopause.  No indication to repeat the Pap test.  Breast exam normal.  Last mammogram 2018, will schedule now.  Colonoscopy 2016.  Health labs with family physician.  Good body mass index at 24.09.  Continue with fitness and healthy nutrition.  2. Post-menopausal Well on no hormone replacement therapy.  No postmenopausal bleeding.  3. Screening for osteoporosis No recent bone density.  Will schedule now.  Vitamin D supplements, calcium intake of 1200 mg daily.  Regular weightbearing physical activity recommended. - DG Bone Density; Future  Other orders - rosuvastatin (CRESTOR) 5 MG tablet; Take 5 mg by mouth daily.  Marcile, it was a pleasure seeing you today!

## 2019-07-22 ENCOUNTER — Other Ambulatory Visit: Payer: Self-pay

## 2019-07-22 ENCOUNTER — Other Ambulatory Visit: Payer: Self-pay | Admitting: Obstetrics & Gynecology

## 2019-07-22 ENCOUNTER — Ambulatory Visit (INDEPENDENT_AMBULATORY_CARE_PROVIDER_SITE_OTHER): Payer: Medicare Other

## 2019-07-22 DIAGNOSIS — Z13820 Encounter for screening for osteoporosis: Secondary | ICD-10-CM | POA: Diagnosis not present

## 2019-07-22 DIAGNOSIS — Z780 Asymptomatic menopausal state: Secondary | ICD-10-CM

## 2019-09-25 ENCOUNTER — Other Ambulatory Visit: Payer: Self-pay | Admitting: Obstetrics & Gynecology

## 2019-09-25 DIAGNOSIS — Z1231 Encounter for screening mammogram for malignant neoplasm of breast: Secondary | ICD-10-CM

## 2019-10-08 ENCOUNTER — Ambulatory Visit
Admission: RE | Admit: 2019-10-08 | Discharge: 2019-10-08 | Disposition: A | Discharge status: Home / Self Care | Payer: Medicare Other | Source: Ambulatory Visit | Attending: Obstetrics & Gynecology | Admitting: Obstetrics & Gynecology

## 2019-10-08 ENCOUNTER — Other Ambulatory Visit: Payer: Self-pay

## 2019-10-08 DIAGNOSIS — Z1231 Encounter for screening mammogram for malignant neoplasm of breast: Secondary | ICD-10-CM

## 2020-02-16 DIAGNOSIS — M25521 Pain in right elbow: Secondary | ICD-10-CM | POA: Diagnosis not present

## 2020-02-16 DIAGNOSIS — M5412 Radiculopathy, cervical region: Secondary | ICD-10-CM | POA: Diagnosis not present

## 2020-03-26 DIAGNOSIS — H401122 Primary open-angle glaucoma, left eye, moderate stage: Secondary | ICD-10-CM | POA: Diagnosis not present

## 2020-06-17 DIAGNOSIS — L723 Sebaceous cyst: Secondary | ICD-10-CM | POA: Diagnosis not present

## 2020-06-17 DIAGNOSIS — M7711 Lateral epicondylitis, right elbow: Secondary | ICD-10-CM | POA: Diagnosis not present

## 2020-07-20 ENCOUNTER — Ambulatory Visit: Payer: Medicare Other | Admitting: Obstetrics & Gynecology

## 2020-07-23 ENCOUNTER — Other Ambulatory Visit: Payer: Self-pay | Admitting: Obstetrics & Gynecology

## 2020-07-23 DIAGNOSIS — Z1231 Encounter for screening mammogram for malignant neoplasm of breast: Secondary | ICD-10-CM

## 2020-08-13 DIAGNOSIS — G5601 Carpal tunnel syndrome, right upper limb: Secondary | ICD-10-CM | POA: Diagnosis not present

## 2020-08-13 DIAGNOSIS — M79641 Pain in right hand: Secondary | ICD-10-CM | POA: Diagnosis not present

## 2020-08-18 DIAGNOSIS — G5601 Carpal tunnel syndrome, right upper limb: Secondary | ICD-10-CM | POA: Diagnosis not present

## 2020-09-06 DIAGNOSIS — G5601 Carpal tunnel syndrome, right upper limb: Secondary | ICD-10-CM | POA: Diagnosis not present

## 2020-09-07 DIAGNOSIS — E559 Vitamin D deficiency, unspecified: Secondary | ICD-10-CM | POA: Diagnosis not present

## 2020-09-07 DIAGNOSIS — E782 Mixed hyperlipidemia: Secondary | ICD-10-CM | POA: Diagnosis not present

## 2020-09-07 DIAGNOSIS — Z79899 Other long term (current) drug therapy: Secondary | ICD-10-CM | POA: Diagnosis not present

## 2020-09-07 DIAGNOSIS — R7303 Prediabetes: Secondary | ICD-10-CM | POA: Diagnosis not present

## 2020-09-09 DIAGNOSIS — D692 Other nonthrombocytopenic purpura: Secondary | ICD-10-CM | POA: Diagnosis not present

## 2020-09-09 DIAGNOSIS — R7303 Prediabetes: Secondary | ICD-10-CM | POA: Diagnosis not present

## 2020-09-09 DIAGNOSIS — Z79899 Other long term (current) drug therapy: Secondary | ICD-10-CM | POA: Diagnosis not present

## 2020-09-09 DIAGNOSIS — E782 Mixed hyperlipidemia: Secondary | ICD-10-CM | POA: Diagnosis not present

## 2020-09-09 DIAGNOSIS — Z0000 Encounter for general adult medical examination without abnormal findings: Secondary | ICD-10-CM | POA: Diagnosis not present

## 2020-09-09 DIAGNOSIS — M1990 Unspecified osteoarthritis, unspecified site: Secondary | ICD-10-CM | POA: Diagnosis not present

## 2020-09-09 DIAGNOSIS — I10 Essential (primary) hypertension: Secondary | ICD-10-CM | POA: Diagnosis not present

## 2020-09-09 DIAGNOSIS — Z23 Encounter for immunization: Secondary | ICD-10-CM | POA: Diagnosis not present

## 2020-09-09 DIAGNOSIS — Z86010 Personal history of colonic polyps: Secondary | ICD-10-CM | POA: Diagnosis not present

## 2020-09-09 DIAGNOSIS — E559 Vitamin D deficiency, unspecified: Secondary | ICD-10-CM | POA: Diagnosis not present

## 2020-09-09 DIAGNOSIS — G5601 Carpal tunnel syndrome, right upper limb: Secondary | ICD-10-CM | POA: Diagnosis not present

## 2020-09-14 DIAGNOSIS — G5601 Carpal tunnel syndrome, right upper limb: Secondary | ICD-10-CM | POA: Diagnosis not present

## 2020-09-27 DIAGNOSIS — H401122 Primary open-angle glaucoma, left eye, moderate stage: Secondary | ICD-10-CM | POA: Diagnosis not present

## 2020-09-27 DIAGNOSIS — Z961 Presence of intraocular lens: Secondary | ICD-10-CM | POA: Diagnosis not present

## 2020-09-27 DIAGNOSIS — H353131 Nonexudative age-related macular degeneration, bilateral, early dry stage: Secondary | ICD-10-CM | POA: Diagnosis not present

## 2020-09-28 DIAGNOSIS — G5601 Carpal tunnel syndrome, right upper limb: Secondary | ICD-10-CM | POA: Diagnosis not present

## 2020-10-07 ENCOUNTER — Other Ambulatory Visit: Payer: Self-pay

## 2020-10-07 ENCOUNTER — Ambulatory Visit
Admission: RE | Admit: 2020-10-07 | Discharge: 2020-10-07 | Disposition: A | Discharge status: Home / Self Care | Payer: Medicare Other | Source: Ambulatory Visit | Attending: Obstetrics & Gynecology | Admitting: Obstetrics & Gynecology

## 2020-10-07 ENCOUNTER — Other Ambulatory Visit: Payer: Self-pay | Admitting: Obstetrics & Gynecology

## 2020-10-07 DIAGNOSIS — Z1231 Encounter for screening mammogram for malignant neoplasm of breast: Secondary | ICD-10-CM

## 2020-10-08 ENCOUNTER — Ambulatory Visit
Admission: RE | Admit: 2020-10-08 | Discharge: 2020-10-08 | Disposition: A | Discharge status: Home / Self Care | Payer: Medicare Other | Source: Ambulatory Visit | Attending: Obstetrics & Gynecology | Admitting: Obstetrics & Gynecology

## 2020-10-08 DIAGNOSIS — Z1231 Encounter for screening mammogram for malignant neoplasm of breast: Secondary | ICD-10-CM | POA: Diagnosis not present

## 2020-10-20 ENCOUNTER — Ambulatory Visit (INDEPENDENT_AMBULATORY_CARE_PROVIDER_SITE_OTHER): Payer: Medicare Other | Admitting: Obstetrics & Gynecology

## 2020-10-20 ENCOUNTER — Encounter: Payer: Self-pay | Admitting: Obstetrics & Gynecology

## 2020-10-20 ENCOUNTER — Other Ambulatory Visit: Payer: Self-pay

## 2020-10-20 VITALS — BP 134/82 | HR 78 | Resp 18 | Ht 64.5 in | Wt 139.4 lb

## 2020-10-20 DIAGNOSIS — Z01419 Encounter for gynecological examination (general) (routine) without abnormal findings: Secondary | ICD-10-CM | POA: Diagnosis not present

## 2020-10-20 DIAGNOSIS — Z780 Asymptomatic menopausal state: Secondary | ICD-10-CM

## 2020-10-20 NOTE — Progress Notes (Signed)
    Marisa Holden Jul 01, 1945 [id number]   History:    75 y.o. Married.  Enjoys painting and pottery/sculpture.   RP:  Established patient presenting for annual gyn exam    HPI: Postmenopausal, well on no hormone replacement therapy.  No postmenopausal bleeding.  Abstinent.  Pap Neg in 2018.  Urine and bowel movements normal.  Breasts normal. Screening mammo 09/2020 Neg. Body mass index 23.56.  Health labs with family physician.  Colono 2016.  BD normal in 07/2019.  Past medical history,surgical history, family history and social history were all reviewed and documented in the EPIC chart.  Gynecologic History No LMP recorded. Patient is postmenopausal.  Obstetric History OB History  Gravida Para Term Preterm AB Living  0 0 0 0 0 0  SAB IAB Ectopic Multiple Live Births  0 0 0 0 0     ROS: A ROS was performed and pertinent positives and negatives are included in the history.  GENERAL: No fevers or chills. HEENT: No change in vision, no earache, sore throat or sinus congestion. NECK: No pain or stiffness. CARDIOVASCULAR: No chest pain or pressure. No palpitations. PULMONARY: No shortness of breath, cough or wheeze. GASTROINTESTINAL: No abdominal pain, nausea, vomiting or diarrhea, melena or bright red blood per rectum. GENITOURINARY: No urinary frequency, urgency, hesitancy or dysuria. MUSCULOSKELETAL: No joint or muscle pain, no back pain, no recent trauma. DERMATOLOGIC: No rash, no itching, no lesions. ENDOCRINE: No polyuria, polydipsia, no heat or cold intolerance. No recent change in weight. HEMATOLOGICAL: No anemia or easy bruising or bleeding. NEUROLOGIC: No headache, seizures, numbness, tingling or weakness. PSYCHIATRIC: No depression, no loss of interest in normal activity or change in sleep pattern.     Exam:   BP 134/82 (BP Location: Left Arm)   Pulse 78   Resp 18   Ht 5' 4.5" (1.638 m) Comment: with boots on, patient declined to remove  Wt 139 lb 6.4 oz (63.2 kg)   BMI 23.56  kg/m   Body mass index is 23.56 kg/m.  General appearance : Well developed well nourished female. No acute distress HEENT: Eyes: no retinal hemorrhage or exudates,  Neck supple, trachea midline, no carotid bruits, no thyroidmegaly Lungs: Clear to auscultation, no rhonchi or wheezes, or rib retractions  Heart: Regular rate and rhythm, no murmurs or gallops Breast:Examined in sitting and supine position were symmetrical in appearance, no palpable masses or tenderness,  no skin retraction, no nipple inversion, no nipple discharge, no skin discoloration, no axillary or supraclavicular lymphadenopathy Abdomen: no palpable masses or tenderness, no rebound or guarding Extremities: no edema or skin discoloration or tenderness  Pelvic: Vulva: Normal             Vagina: No gross lesions or discharge  Cervix: No gross lesions or discharge  Uterus  AV, normal size, shape and consistency, non-tender and mobile  Adnexa  Without masses or tenderness  Anus: Normal   Assessment/Plan:  75 y.o. female for annual exam   1. Well female exam with routine gynecological exam Normal gynecologic exam.  Pap negative in 2018, no indication to repeat at this time.  Breasts normal.  Screening mammo Neg 09/2020.  BMI 23.56.  Colono 2016.  Health labs with Fam MD.  2. Post-menopausal  Well on no HRT.  No PMB.  Bone density Normal 07/2019.  [name] MD, 3:39 PM 10/20/2020

## 2020-12-08 DIAGNOSIS — G5601 Carpal tunnel syndrome, right upper limb: Secondary | ICD-10-CM | POA: Diagnosis not present

## 2020-12-08 DIAGNOSIS — M19042 Primary osteoarthritis, left hand: Secondary | ICD-10-CM | POA: Diagnosis not present

## 2020-12-08 DIAGNOSIS — M79641 Pain in right hand: Secondary | ICD-10-CM | POA: Diagnosis not present

## 2020-12-08 DIAGNOSIS — M79642 Pain in left hand: Secondary | ICD-10-CM | POA: Diagnosis not present

## 2020-12-08 DIAGNOSIS — M180 Bilateral primary osteoarthritis of first carpometacarpal joints: Secondary | ICD-10-CM | POA: Diagnosis not present

## 2020-12-08 DIAGNOSIS — M19041 Primary osteoarthritis, right hand: Secondary | ICD-10-CM | POA: Diagnosis not present

## 2021-02-21 DIAGNOSIS — H401122 Primary open-angle glaucoma, left eye, moderate stage: Secondary | ICD-10-CM | POA: Diagnosis not present

## 2021-05-03 DIAGNOSIS — H0100A Unspecified blepharitis right eye, upper and lower eyelids: Secondary | ICD-10-CM | POA: Diagnosis not present

## 2021-05-03 DIAGNOSIS — H04123 Dry eye syndrome of bilateral lacrimal glands: Secondary | ICD-10-CM | POA: Diagnosis not present

## 2021-05-03 DIAGNOSIS — H0100B Unspecified blepharitis left eye, upper and lower eyelids: Secondary | ICD-10-CM | POA: Diagnosis not present

## 2021-05-03 DIAGNOSIS — H353131 Nonexudative age-related macular degeneration, bilateral, early dry stage: Secondary | ICD-10-CM | POA: Diagnosis not present

## 2021-08-24 DIAGNOSIS — H401122 Primary open-angle glaucoma, left eye, moderate stage: Secondary | ICD-10-CM | POA: Diagnosis not present

## 2021-10-11 ENCOUNTER — Other Ambulatory Visit: Payer: Self-pay | Admitting: Obstetrics & Gynecology

## 2021-10-11 DIAGNOSIS — Z1231 Encounter for screening mammogram for malignant neoplasm of breast: Secondary | ICD-10-CM

## 2021-10-25 ENCOUNTER — Ambulatory Visit: Payer: Medicare Other | Admitting: Obstetrics & Gynecology

## 2021-11-09 ENCOUNTER — Ambulatory Visit
Admission: RE | Admit: 2021-11-09 | Discharge: 2021-11-09 | Disposition: A | Discharge status: Home / Self Care | Payer: Medicare Other | Source: Ambulatory Visit | Attending: Obstetrics & Gynecology | Admitting: Obstetrics & Gynecology

## 2021-11-09 DIAGNOSIS — Z1231 Encounter for screening mammogram for malignant neoplasm of breast: Secondary | ICD-10-CM

## 2021-11-22 DIAGNOSIS — Z79899 Other long term (current) drug therapy: Secondary | ICD-10-CM | POA: Diagnosis not present

## 2021-11-22 DIAGNOSIS — E782 Mixed hyperlipidemia: Secondary | ICD-10-CM | POA: Diagnosis not present

## 2021-11-22 DIAGNOSIS — E559 Vitamin D deficiency, unspecified: Secondary | ICD-10-CM | POA: Diagnosis not present

## 2021-11-22 DIAGNOSIS — R7303 Prediabetes: Secondary | ICD-10-CM | POA: Diagnosis not present

## 2021-11-24 DIAGNOSIS — M1990 Unspecified osteoarthritis, unspecified site: Secondary | ICD-10-CM | POA: Diagnosis not present

## 2021-11-24 DIAGNOSIS — E559 Vitamin D deficiency, unspecified: Secondary | ICD-10-CM | POA: Diagnosis not present

## 2021-11-24 DIAGNOSIS — G5601 Carpal tunnel syndrome, right upper limb: Secondary | ICD-10-CM | POA: Diagnosis not present

## 2021-11-24 DIAGNOSIS — E782 Mixed hyperlipidemia: Secondary | ICD-10-CM | POA: Diagnosis not present

## 2021-11-24 DIAGNOSIS — D692 Other nonthrombocytopenic purpura: Secondary | ICD-10-CM | POA: Diagnosis not present

## 2021-11-24 DIAGNOSIS — Z0000 Encounter for general adult medical examination without abnormal findings: Secondary | ICD-10-CM | POA: Diagnosis not present

## 2021-11-24 DIAGNOSIS — R7303 Prediabetes: Secondary | ICD-10-CM | POA: Diagnosis not present

## 2021-11-24 DIAGNOSIS — Z79899 Other long term (current) drug therapy: Secondary | ICD-10-CM | POA: Diagnosis not present

## 2021-11-24 DIAGNOSIS — I10 Essential (primary) hypertension: Secondary | ICD-10-CM | POA: Diagnosis not present

## 2021-11-24 DIAGNOSIS — Z86010 Personal history of colonic polyps: Secondary | ICD-10-CM | POA: Diagnosis not present

## 2021-12-08 ENCOUNTER — Ambulatory Visit (INDEPENDENT_AMBULATORY_CARE_PROVIDER_SITE_OTHER): Payer: Medicare Other | Admitting: Obstetrics & Gynecology

## 2021-12-08 ENCOUNTER — Encounter: Payer: Self-pay | Admitting: Obstetrics & Gynecology

## 2021-12-08 VITALS — BP 112/74 | HR 80 | Ht 62.75 in | Wt 142.0 lb

## 2021-12-08 DIAGNOSIS — Z9189 Other specified personal risk factors, not elsewhere classified: Secondary | ICD-10-CM | POA: Diagnosis not present

## 2021-12-08 DIAGNOSIS — Z780 Asymptomatic menopausal state: Secondary | ICD-10-CM

## 2021-12-08 DIAGNOSIS — Z01419 Encounter for gynecological examination (general) (routine) without abnormal findings: Secondary | ICD-10-CM

## 2021-12-08 NOTE — Progress Notes (Signed)
Marisa Holden 05-May-1945 [id number]   History:    76 y.o. G0 Married.  Enjoys painting and pottery/sculpture.   RP:  Established patient presenting for annual gyn exam    HPI: Postmenopausal, well on no hormone replacement therapy.  No postmenopausal bleeding.  Abstinent.  Pap Neg in 06/2016.  Urine and bowel movements normal.  Breasts normal. Screening mammo 11/2021 Neg. Body mass index 25.36.  Health labs with family physician.  Colono 2016.  BD normal in 07/2019.   Past medical history,surgical history, family history and social history were all reviewed and documented in the EPIC chart.  Gynecologic History No LMP recorded. Patient is postmenopausal.  Obstetric History OB History  Gravida Para Term Preterm AB Living  0 0 0 0 0 0  SAB IAB Ectopic Multiple Live Births  0 0 0 0 0     ROS: A ROS was performed and pertinent positives and negatives are included in the history. GENERAL: No fevers or chills. HEENT: No change in vision, no earache, sore throat or sinus congestion. NECK: No pain or stiffness. CARDIOVASCULAR: No chest pain or pressure. No palpitations. PULMONARY: No shortness of breath, cough or wheeze. GASTROINTESTINAL: No abdominal pain, nausea, vomiting or diarrhea, melena or bright red blood per rectum. GENITOURINARY: No urinary frequency, urgency, hesitancy or dysuria. MUSCULOSKELETAL: No joint or muscle pain, no back pain, no recent trauma. DERMATOLOGIC: No rash, no itching, no lesions. ENDOCRINE: No polyuria, polydipsia, no heat or cold intolerance. No recent change in weight. HEMATOLOGICAL: No anemia or easy bruising or bleeding. NEUROLOGIC: No headache, seizures, numbness, tingling or weakness. PSYCHIATRIC: No depression, no loss of interest in normal activity or change in sleep pattern.     Exam:   BP 112/74   Pulse 80   Ht 5' 2.75" (1.594 m)   Wt 142 lb (64.4 kg)   SpO2 98%   BMI 25.36 kg/m   Body mass index is 25.36 kg/m.  General appearance : Well  developed well nourished female. No acute distress HEENT: Eyes: no retinal hemorrhage or exudates,  Neck supple, trachea midline, no carotid bruits, no thyroidmegaly Lungs: Clear to auscultation, no rhonchi or wheezes, or rib retractions  Heart: Regular rate and rhythm, no murmurs or gallops Breast:Examined in sitting and supine position were symmetrical in appearance, no palpable masses or tenderness,  no skin retraction, no nipple inversion, no nipple discharge, no skin discoloration, no axillary or supraclavicular lymphadenopathy Abdomen: no palpable masses or tenderness, no rebound or guarding Extremities: no edema or skin discoloration or tenderness  Pelvic: Vulva: Normal             Vagina: No gross lesions or discharge  Cervix: No gross lesions or discharge  Uterus  AV, normal size, shape and consistency, non-tender and mobile  Adnexa  Without masses or tenderness  Anus: Normal   Assessment/Plan:  76 y.o. female for annual exam   1. Well female exam with routine gynecological exam Postmenopausal, well on no hormone replacement therapy.  No postmenopausal bleeding.  Abstinent.  Pap Neg in 2018.  Urine and bowel movements normal.  Breasts normal. Screening mammo 09/2020 Neg. Body mass index 25.36.  Health labs with family physician.  Colono 2016.  BD normal in 07/2019.  2. Post-menopausal Postmenopausal, well on no hormone replacement therapy.  No postmenopausal bleeding.  Abstinent.   3. Other specified personal risk factors, not elsewhere classified  Other orders - dorzolamide-timolol (COSOPT) 2-0.5 % ophthalmic solution; Place into the left eye 2 (two)  times daily. - LOSARTAN POTASSIUM PO; Take 12.5 mg by mouth. - Multiple Vitamins-Minerals (PRESERVISION AREDS 2 PO); Take by mouth.   [name] MD, 2:11 PM

## 2022-01-04 DIAGNOSIS — G5601 Carpal tunnel syndrome, right upper limb: Secondary | ICD-10-CM | POA: Diagnosis not present

## 2022-01-04 DIAGNOSIS — M65849 Other synovitis and tenosynovitis, unspecified hand: Secondary | ICD-10-CM | POA: Diagnosis not present

## 2022-01-04 DIAGNOSIS — M13841 Other specified arthritis, right hand: Secondary | ICD-10-CM | POA: Diagnosis not present

## 2022-01-04 DIAGNOSIS — R52 Pain, unspecified: Secondary | ICD-10-CM | POA: Diagnosis not present

## 2022-02-20 DIAGNOSIS — R7303 Prediabetes: Secondary | ICD-10-CM | POA: Diagnosis not present

## 2022-02-20 DIAGNOSIS — L608 Other nail disorders: Secondary | ICD-10-CM | POA: Diagnosis not present

## 2022-02-20 DIAGNOSIS — M79605 Pain in left leg: Secondary | ICD-10-CM | POA: Diagnosis not present

## 2022-03-02 DIAGNOSIS — H353132 Nonexudative age-related macular degeneration, bilateral, intermediate dry stage: Secondary | ICD-10-CM | POA: Diagnosis not present

## 2022-03-02 DIAGNOSIS — H401131 Primary open-angle glaucoma, bilateral, mild stage: Secondary | ICD-10-CM | POA: Diagnosis not present

## 2022-03-02 DIAGNOSIS — Z961 Presence of intraocular lens: Secondary | ICD-10-CM | POA: Diagnosis not present

## 2022-05-09 ENCOUNTER — Ambulatory Visit: Payer: Medicare Other | Admitting: Internal Medicine

## 2022-05-09 ENCOUNTER — Encounter: Payer: Self-pay | Admitting: Internal Medicine

## 2022-05-09 VITALS — BP 144/82 | HR 98 | Temp 98.0°F | Resp 18 | Ht 63.5 in | Wt 146.4 lb

## 2022-05-09 DIAGNOSIS — M79601 Pain in right arm: Secondary | ICD-10-CM | POA: Insufficient documentation

## 2022-05-09 MED ORDER — DICLOFENAC SODIUM 1 % EX GEL: 2.0000 g | Freq: Four times a day (QID) | CUTANEOUS | 0 refills | Status: DC

## 2022-05-09 NOTE — Progress Notes (Signed)
   Acute Office Visit  Subjective:     Patient ID: Marisa Holden, female    DOB: Nov 14, 1945, 77 y.o.   MRN: [id number]  Chief Complaint  Patient presents with   office visit    Patient pulled muscle , pain left shoulder    HPI Patient is in today for right arm arm pain since yesterday after she lift some heavy object.  No fall and pain only comes when she lift arm arm up or lift something. She has pain right arm before but this pain is new.   Review of Systems  Musculoskeletal:        Right mid arm pain and tenderness with no swelling, movement at Joint is negative        Objective:    BP (!) 144/82 (BP Location: Left Arm, Patient Position: Sitting, Cuff Size: Normal)   Pulse 98   Temp 98 F (36.7 C)   Resp 18   Ht 5' 3.5" (1.613 m)   SpO2 98%   BMI 24.76 kg/m    Physical Exam Constitutional:      Appearance: Normal appearance.  Musculoskeletal:     Comments: Tenderness mid right arm  Neurological:     Mental Status: She is alert.     No results found for any visits on 05/09/22.      Assessment & Plan:   Problem List Items Addressed This Visit       Other   Right arm pain - Primary    She will apply voltaren gel four times a day and also take tylenol and one aleeve twice a day for 1 week. If pain is not better then she will see her PCP       No orders of the defined types were placed in this encounter.   No follow-ups on file.  Eloisa Northern, MD

## 2022-05-09 NOTE — Assessment & Plan Note (Signed)
She will apply voltaren gel four times a day and also take tylenol and one aleeve twice a day for 1 week. If pain is not better then she will see her PCP

## 2022-05-17 DIAGNOSIS — Z96641 Presence of right artificial hip joint: Secondary | ICD-10-CM | POA: Diagnosis not present

## 2022-05-17 DIAGNOSIS — M7061 Trochanteric bursitis, right hip: Secondary | ICD-10-CM | POA: Diagnosis not present

## 2022-06-05 ENCOUNTER — Telehealth: Payer: Self-pay | Admitting: Obstetrics & Gynecology

## 2022-06-05 DIAGNOSIS — N950 Postmenopausal bleeding: Secondary | ICD-10-CM

## 2022-06-05 NOTE — Telephone Encounter (Signed)
Patient called for PMB.  Will investigate with an EBx and a Pelvic US.

## 2022-06-05 NOTE — Telephone Encounter (Signed)
See phone call note

## 2022-06-06 ENCOUNTER — Other Ambulatory Visit: Payer: Self-pay | Admitting: Obstetrics & Gynecology

## 2022-06-06 ENCOUNTER — Ambulatory Visit (INDEPENDENT_AMBULATORY_CARE_PROVIDER_SITE_OTHER): Payer: Medicare Other

## 2022-06-06 ENCOUNTER — Encounter: Payer: Self-pay | Admitting: Obstetrics & Gynecology

## 2022-06-06 ENCOUNTER — Ambulatory Visit: Payer: Medicare Other | Admitting: Obstetrics & Gynecology

## 2022-06-06 ENCOUNTER — Other Ambulatory Visit (HOSPITAL_COMMUNITY)
Admission: RE | Admit: 2022-06-06 | Discharge: 2022-06-06 | Disposition: A | Discharge status: Home / Self Care | Payer: Medicare Other | Source: Ambulatory Visit | Attending: Obstetrics & Gynecology | Admitting: Obstetrics & Gynecology

## 2022-06-06 VITALS — BP 142/86 | HR 84 | Resp 16 | Ht 64.0 in | Wt 140.0 lb

## 2022-06-06 DIAGNOSIS — N950 Postmenopausal bleeding: Secondary | ICD-10-CM

## 2022-06-06 DIAGNOSIS — N840 Polyp of corpus uteri: Secondary | ICD-10-CM

## 2022-06-06 DIAGNOSIS — R9389 Abnormal findings on diagnostic imaging of other specified body structures: Secondary | ICD-10-CM | POA: Diagnosis not present

## 2022-06-06 LAB — CBC
HCT: 40.8 % (ref 35.0–45.0)
Hemoglobin: 13.6 g/dL (ref 11.7–15.5)
MCH: 30.9 pg (ref 27.0–33.0)
MCHC: 33.3 g/dL (ref 32.0–36.0)
MCV: 92.7 fL (ref 80.0–100.0)
MPV: 11.2 fL (ref 7.5–12.5)
Platelets: 230 10*3/uL (ref 140–400)
RBC: 4.4 10*6/uL (ref 3.80–5.10)
RDW: 12.4 % (ref 11.0–15.0)
WBC: 10.3 10*3/uL (ref 3.8–10.8)

## 2022-06-06 NOTE — Telephone Encounter (Signed)
Per ML: "Patient called on 5/27th am to report PMB on 5/26th and 5/27th.  Small amount of red blood from the vagina.  The mild vaginal bleeding was accompanied by pelvic cramps.  No UTI Sx and no rectal bleeding.  Abstinent.  No HRT.   Please schedule patient for OV with me.  Will proceed with an EBx.  If possible schedule a Pelvic US at the same visit.  If not possible, will organize the Pelvic US when I see her.  Dr Seymour Bars"   Appt on 5.28.2024. Order for US/EMB placed. Will close encounter.

## 2022-06-06 NOTE — Progress Notes (Signed)
    Marisa Holden 1945-02-15 [id number]        77 y.o.  G0  RP: PMB x 3 days  HPI: Light PMB x 3 days.  Mild pelvic cramping.  No HRT.  Abstinent. Nothing in the vagina. No trauma.  No vaginal discharge, no itching, no odor.  No UTI Sx.  No rectal bleeding.  Normal BMs.  No fever.   OB History  Gravida Para Term Preterm AB Living  0 0 0 0 0 0  SAB IAB Ectopic Multiple Live Births  0 0 0 0 0    Past medical history,surgical history, problem list, medications, allergies, family history and social history were all reviewed and documented in the EPIC chart.   Directed ROS with pertinent positives and negatives documented in the history of present illness/assessment and plan.  Exam:  There were no vitals filed for this visit. General appearance:  Normal  Sono Infusion Hysterogram ( procedure note)   The initial transvaginal ultrasound demonstrated the following:  T/V images.  No latex used.  Anteverted uterus normal in size and shape with no myometrial mass.  The uterus is measured at 7.6 x 4.57 x 3.32 cm.  The endometrial lining is measured at 9.19 mm.  A feeder vessel is suspected by Doppler.  Both ovaries are atrophic and normal in appearance.  No adnexal mass seen.  No free fluid in the pelvis.  The speculum  was inserted and the cervix cleansed with Betadine solution after confirming that patient has no allergies.A small sonohysterography catheter was utilized.  The cervix was stenotic, it was successfully dilated using the very small dilators and the os finder.  Insertion of the sonohysterography catheter was facilitated with ring forceps, using a spear-like motion the catheter was inserted to the fundus of the uterus. The speculum is then removed carefully to avoid dislodging the catheter. The catheter was flushed with sterile saline delete prior to insertion to rid it of small amounts of air.the sterile saline solution was infused into the uterine cavity as a vaginal ultrasound  probe was then placed in the vagina for full visualization of the uterine cavity from a transvaginal approach. The following was noted:  Irregular polypoid areas seen in the intra uterine cavity.  Difficult to distinguish how many.  The catheter was then removed after retrieving some of the saline from the intrauterine cavity. An endometrial biopsy was done. Patient tolerated procedure well.    Assessment/Plan:  77 y.o. G0P0000   1. Postmenopausal bleeding  Light PMB x 3 days.  Mild pelvic cramping.  No HRT.  Abstinent. Nothing in the vagina. No trauma.  No vaginal discharge, no itching, no odor.  No UTI Sx.  No rectal bleeding.  Normal BMs.  No fever. Pelvic US showing a thickened endometrial line with a feeder vessel.  Sonohysto therefore done.  Cervical stenosis overcome with the very small dilator kit and the os finder.  Irregular polypoid areas seen in the intra uterine cavity.  Endometrial biopsy done successfully, pathology pending.  Management per endometrial biopsy pathology results.  Patient tolerated the procedure well, no complication.  Postprocedure precautions reviewed.  A CBC will be done to rule out anemia.  Patient recommended to take a supplement of iron sulfate.   Genia Del MD, 1:43 PM 06/06/2022

## 2022-06-06 NOTE — Addendum Note (Signed)
Addended by: Jodelle Red D on: 06/06/2022 01:59 PM   Modules accepted: Orders

## 2022-06-08 ENCOUNTER — Telehealth: Payer: Self-pay

## 2022-06-08 DIAGNOSIS — Z96641 Presence of right artificial hip joint: Secondary | ICD-10-CM | POA: Diagnosis not present

## 2022-06-08 DIAGNOSIS — M7061 Trochanteric bursitis, right hip: Secondary | ICD-10-CM | POA: Diagnosis not present

## 2022-06-08 NOTE — Telephone Encounter (Signed)
Genia Del, MD  You; [facility] Triage29 minutes ago (1:42 PM)    Normal to be bleeding more as she has intrauterine lesions that have been disrupted by the EBx.  Rest and good hydration.  FeSO4 325 mg daily.  Will manage per EBx results.  Present to ER if severe hemorrhage. Dr. Geradine Girt with patient. Patient denies any signs/symptoms of anemia, denies heavy bleeding. Advised per Dr. Seymour Bars. Patient verbalizes understanding and is agreeable.   Encounter closed.

## 2022-06-08 NOTE — Telephone Encounter (Signed)
Pt calling to report increased vaginal bleeding since sonohyst/EMB performed on 5/28.  States she is having to change maxi pads ~3 times a day and feels as if bleeding is worse in AM w/ some small clots. Also notices blood when using restroom in toilet throughout the day.  Pt also reports some pelvic cramping but tylenol is helping with that.   Pt advised to rest and push fluids and will contact her back w/ response from provider. Pt voiced understanding.  Please advise.

## 2022-06-09 LAB — SURGICAL PATHOLOGY

## 2022-06-12 ENCOUNTER — Telehealth: Payer: Self-pay | Admitting: *Deleted

## 2022-06-12 NOTE — Telephone Encounter (Signed)
-----   Message from Genia Del, MD sent at 06/09/2022  3:33 PM EDT ----- Schedule HSC/D+C/Myosure Excision. Dr Elbert Ewings ----- Message ----- From: Interface, [facility] Results In Sent: 06/06/2022  10:51 PM EDT To: Genia Del, MD

## 2022-06-12 NOTE — Telephone Encounter (Signed)
  Leda Min, RN 06/12/2022  9:49 AM EDT Back to Top    Spoke with patient, advised as seen below per DR. Lavoie. Reviewed surgery dates, will proceed with scheduling on 06/28/22 at [facility]. Patient aware I will return call once scheduled. Patient verbalizes understanding and is agreeable.    Dr. Seymour Bars -please send surgery request.

## 2022-06-13 ENCOUNTER — Other Ambulatory Visit: Payer: Self-pay

## 2022-06-13 ENCOUNTER — Encounter (HOSPITAL_BASED_OUTPATIENT_CLINIC_OR_DEPARTMENT_OTHER): Payer: Self-pay | Admitting: Obstetrics & Gynecology

## 2022-06-13 NOTE — Telephone Encounter (Signed)
Spoke with patient. Surgery date request confirmed.  Advised surgery is scheduled for [facility] on 06/28/22 at 0830.  Surgery instruction sheet and hospital brochure reviewed, printed copy will be mailed.  Patient verbalizes understanding and is agreeable.  Patient notified of benefits per [place].  Routing to provider. Encounter closed.

## 2022-06-13 NOTE — Telephone Encounter (Signed)
Surgery request sent 

## 2022-06-13 NOTE — Progress Notes (Signed)
Spoke w/ via phone for pre-op interview---pt Lab needs dos----  EKG, I stat            Lab results------none COVID test -----patient states asymptomatic no test needed Arrive at -------630 am 06-28-2022 NPO after MN NO Solid Food.  Clear liquids from MN until---530 am Med rec completed Medications to take morning of surgery -----Rosuvastatin, eye drop Diabetic medication -----n/a Patient instructed no nail polish to be worn day of surgery Patient instructed to bring photo id and insurance card day of surgery Patient aware to have Driver (ride ) / caregiver   husband Marisa Holden  for 24 hours after surgery  Patient Special Instructions -----none Pre-Op special Instructions -----none Patient verbalized understanding of instructions that were given at this phone interview. Patient denies shortness of breath, chest pain, fever, cough at this phone interview.

## 2022-06-13 NOTE — Telephone Encounter (Signed)
Call returned to patient, notified of surgery time change, Start 1000, arrive at 0800, [facility].   Call to patient, no answer, no option to leave voicemail.

## 2022-06-21 ENCOUNTER — Encounter: Payer: Self-pay | Admitting: Obstetrics & Gynecology

## 2022-06-21 ENCOUNTER — Ambulatory Visit (INDEPENDENT_AMBULATORY_CARE_PROVIDER_SITE_OTHER): Payer: Medicare Other | Admitting: Obstetrics & Gynecology

## 2022-06-21 VITALS — BP 130/80 | HR 84 | Ht 62.75 in | Wt 140.0 lb

## 2022-06-21 DIAGNOSIS — N840 Polyp of corpus uteri: Secondary | ICD-10-CM

## 2022-06-21 DIAGNOSIS — R9389 Abnormal findings on diagnostic imaging of other specified body structures: Secondary | ICD-10-CM

## 2022-06-21 DIAGNOSIS — N950 Postmenopausal bleeding: Secondary | ICD-10-CM | POA: Diagnosis not present

## 2022-06-21 NOTE — Progress Notes (Signed)
GERAL WENSTROM [birth year] [id number]        77 y.o.  G0 Husband present  RP: Preop D&C/hysteroscopy with Myosure Excision on 06/28/22  HPI: Light PMB x 3 days. Mild pelvic cramping. No HRT. Abstinent. Nothing in the vagina. No trauma. No vaginal discharge, no itching, no odor. No UTI Sx. No rectal bleeding. Normal BMs. No fever.  Sonohysto showed Irregular polypoid areas seen in the intra uterine cavity.  Endometrial biopsy done successfully, pathology benign.     OB History  Gravida Para Term Preterm AB Living  0 0 0 0 0 0  SAB IAB Ectopic Multiple Live Births  0 0 0 0 0    Past medical history,surgical history, problem list, medications, allergies, family history and social history were all reviewed and documented in the EPIC chart.   Directed ROS with pertinent positives and negatives documented in the history of present illness/assessment and plan.  Exam:  Vitals:   06/21/22 1326  BP: 130/80  Pulse: 84  SpO2: 97%  Weight: 140 lb (63.5 kg)  Height: 5' 2.75" (1.594 m)   General appearance:  Normal  Sono Infusion Hysterogram ( procedure note)     The initial transvaginal ultrasound demonstrated the following:   T/V images.  No latex used.  Anteverted uterus normal in size and shape with no myometrial mass.  The uterus is measured at 7.6 x 4.57 x 3.32 cm.  The endometrial lining is measured at 9.19 mm.  A feeder vessel is suspected by Doppler.  Both ovaries are atrophic and normal in appearance.  No adnexal mass seen.  No free fluid in the pelvis.   The speculum  was inserted and the cervix cleansed with Betadine solution after confirming that patient has no allergies.A small sonohysterography catheter was utilized.  The cervix was stenotic, it was successfully dilated using the very small dilators and the os finder.  Insertion of the sonohysterography catheter was facilitated with ring forceps, using a spear-like motion the catheter was inserted to the fundus of the  uterus. The speculum is then removed carefully to avoid dislodging the catheter. The catheter was flushed with sterile saline delete prior to insertion to rid it of small amounts of air.the sterile saline solution was infused into the uterine cavity as a vaginal ultrasound probe was then placed in the vagina for full visualization of the uterine cavity from a transvaginal approach. The following was noted:   Irregular polypoid areas seen in the intra uterine cavity.  Difficult to distinguish how many.   The catheter was then removed after retrieving some of the saline from the intrauterine cavity. An endometrial biopsy was done. Patient tolerated procedure well.      Assessment/Plan:  77 y.o. G0   1. Postmenopausal bleeding  Light PMB x 3 days.  Mild pelvic cramping.  No HRT.  Abstinent. Nothing in the vagina. No trauma.  No vaginal discharge, no itching, no odor.  No UTI Sx.  No rectal bleeding.  Normal BMs.  No fever. Pelvic US showing a thickened endometrial line with a feeder vessel.  Sonohysto therefore done.  Cervical stenosis overcome with the very small dilator kit and the os finder.  Irregular polypoid areas seen in the intra uterine cavity.  Endometrial biopsy done successfully, pathology benign.   HSC/Myosure Excisions/D+C scheduled on 06/28/22.  Preop prep, surgery and risks, postop expectations and precautions reviewed thoroughly.  2. Thickened endometrium As above.  3. Endometrial polyp As above.  Other orders -  losartan-hydrochlorothiazide (HYZAAR) 50-12.5 MG tablet; Take 1 tablet by mouth daily.                         Patient was counseled as to the risk of surgery to include the following:  1. Infection (prohylactic antibiotics will be administered)  2. DVT/Pulmonary Embolism (prophylactic pneumo compression stockings will be used)  3.Trauma to internal organs requiring additional surgical procedure to repair any injury to internal organs requiring perhaps additional  hospitalization days.  4.Hemmorhage requiring transfusion and blood products which carry risks such as anaphylactic reaction, hepatitis and AIDS  Patient had received literature information on the procedure scheduled and all her questions were answered and fully accepts all risk.   Genia Del MD, 1:34 PM 06/21/2022

## 2022-06-28 ENCOUNTER — Other Ambulatory Visit: Payer: Self-pay

## 2022-06-28 ENCOUNTER — Ambulatory Visit (HOSPITAL_BASED_OUTPATIENT_CLINIC_OR_DEPARTMENT_OTHER): Payer: Medicare Other | Admitting: Anesthesiology

## 2022-06-28 ENCOUNTER — Encounter (HOSPITAL_BASED_OUTPATIENT_CLINIC_OR_DEPARTMENT_OTHER): Payer: Self-pay | Admitting: Obstetrics & Gynecology

## 2022-06-28 ENCOUNTER — Encounter (HOSPITAL_BASED_OUTPATIENT_CLINIC_OR_DEPARTMENT_OTHER)
Admission: RE | Disposition: A | Discharge status: Home / Self Care | Payer: Self-pay | Source: Home / Self Care | Attending: Obstetrics & Gynecology

## 2022-06-28 ENCOUNTER — Ambulatory Visit (HOSPITAL_BASED_OUTPATIENT_CLINIC_OR_DEPARTMENT_OTHER)
Admission: RE | Admit: 2022-06-28 | Discharge: 2022-06-28 | Disposition: A | Discharge status: Home / Self Care | Payer: Medicare Other | Attending: Obstetrics & Gynecology | Admitting: Obstetrics & Gynecology

## 2022-06-28 DIAGNOSIS — N840 Polyp of corpus uteri: Secondary | ICD-10-CM | POA: Diagnosis not present

## 2022-06-28 DIAGNOSIS — Z87891 Personal history of nicotine dependence: Secondary | ICD-10-CM

## 2022-06-28 DIAGNOSIS — R9389 Abnormal findings on diagnostic imaging of other specified body structures: Secondary | ICD-10-CM | POA: Insufficient documentation

## 2022-06-28 DIAGNOSIS — I10 Essential (primary) hypertension: Secondary | ICD-10-CM

## 2022-06-28 DIAGNOSIS — D250 Submucous leiomyoma of uterus: Secondary | ICD-10-CM | POA: Diagnosis not present

## 2022-06-28 DIAGNOSIS — M1990 Unspecified osteoarthritis, unspecified site: Secondary | ICD-10-CM

## 2022-06-28 DIAGNOSIS — Z01818 Encounter for other preprocedural examination: Secondary | ICD-10-CM

## 2022-06-28 DIAGNOSIS — N950 Postmenopausal bleeding: Secondary | ICD-10-CM | POA: Diagnosis not present

## 2022-06-28 HISTORY — DX: Postmenopausal bleeding: N95.0

## 2022-06-28 HISTORY — DX: Presence of external hearing-aid: Z97.4

## 2022-06-28 HISTORY — PX: DILATATION & CURETTAGE/HYSTEROSCOPY WITH MYOSURE: SHX6511

## 2022-06-28 LAB — POCT I-STAT, CHEM 8
BUN: 22 mg/dL (ref 8–23)
Calcium, Ion: 1.27 mmol/L (ref 1.15–1.40)
Chloride: 103 mmol/L (ref 98–111)
Creatinine, Ser: 0.7 mg/dL (ref 0.44–1.00)
Glucose, Bld: 123 mg/dL — ABNORMAL HIGH (ref 70–99)
HCT: 37 % (ref 36.0–46.0)
Hemoglobin: 12.6 g/dL (ref 12.0–15.0)
Potassium: 3.4 mmol/L — ABNORMAL LOW (ref 3.5–5.1)
Sodium: 140 mmol/L (ref 135–145)
TCO2: 27 mmol/L (ref 22–32)

## 2022-06-28 SURGERY — DILATATION & CURETTAGE/HYSTEROSCOPY WITH MYOSURE
Anesthesia: Monitor Anesthesia Care

## 2022-06-28 MED ORDER — MIDAZOLAM HCL 2 MG/2ML IJ SOLN
INTRAMUSCULAR | Status: AC
  Filled 2022-06-28: qty 2

## 2022-06-28 MED ORDER — POVIDONE-IODINE 10 % EX SWAB: 2.0000 | Freq: Once | CUTANEOUS | Status: DC

## 2022-06-28 MED ORDER — FENTANYL CITRATE (PF) 100 MCG/2ML IJ SOLN
INTRAMUSCULAR | Status: DC | PRN
  Administered 2022-06-28: 50 ug via INTRAVENOUS

## 2022-06-28 MED ORDER — CEFAZOLIN SODIUM-DEXTROSE 2-4 GM/100ML-% IV SOLN
INTRAVENOUS | Status: AC
  Filled 2022-06-28: qty 100

## 2022-06-28 MED ORDER — MIDAZOLAM HCL 2 MG/2ML IJ SOLN
INTRAMUSCULAR | Status: DC | PRN
  Administered 2022-06-28: 1 mg via INTRAVENOUS

## 2022-06-28 MED ORDER — PROPOFOL 500 MG/50ML IV EMUL
INTRAVENOUS | Status: DC | PRN
  Administered 2022-06-28: 150 ug/kg/min via INTRAVENOUS

## 2022-06-28 MED ORDER — ACETAMINOPHEN 500 MG PO TABS
ORAL_TABLET | ORAL | Status: AC
  Filled 2022-06-28: qty 2

## 2022-06-28 MED ORDER — CELECOXIB 200 MG PO CAPS
ORAL_CAPSULE | ORAL | Status: AC
  Filled 2022-06-28: qty 1

## 2022-06-28 MED ORDER — PROPOFOL 1000 MG/100ML IV EMUL
INTRAVENOUS | Status: AC
  Filled 2022-06-28: qty 200

## 2022-06-28 MED ORDER — LIDOCAINE HCL (CARDIAC) PF 100 MG/5ML IV SOSY
PREFILLED_SYRINGE | INTRAVENOUS | Status: DC | PRN
  Administered 2022-06-28: 50 mg via INTRAVENOUS

## 2022-06-28 MED ORDER — LACTATED RINGERS IV SOLN: INTRAVENOUS | Status: DC

## 2022-06-28 MED ORDER — OXYCODONE HCL 5 MG PO TABS: 5.0000 mg | ORAL_TABLET | Freq: Once | ORAL | Status: DC | PRN

## 2022-06-28 MED ORDER — FENTANYL CITRATE (PF) 100 MCG/2ML IJ SOLN
INTRAMUSCULAR | Status: AC
  Filled 2022-06-28: qty 2

## 2022-06-28 MED ORDER — OXYCODONE HCL 5 MG/5ML PO SOLN: 5.0000 mg | Freq: Once | ORAL | Status: DC | PRN

## 2022-06-28 MED ORDER — FENTANYL CITRATE (PF) 100 MCG/2ML IJ SOLN: 25.0000 ug | INTRAMUSCULAR | Status: DC | PRN

## 2022-06-28 MED ORDER — CEFAZOLIN SODIUM-DEXTROSE 2-4 GM/100ML-% IV SOLN
2.0000 g | INTRAVENOUS | Status: AC
  Administered 2022-06-28: 2 g via INTRAVENOUS

## 2022-06-28 MED ORDER — SODIUM CHLORIDE 0.9 % IR SOLN
Status: DC | PRN
  Administered 2022-06-28: 3000 mL

## 2022-06-28 MED ORDER — CELECOXIB 200 MG PO CAPS
200.0000 mg | ORAL_CAPSULE | Freq: Once | ORAL | Status: AC
  Administered 2022-06-28: 200 mg via ORAL

## 2022-06-28 MED ORDER — ACETAMINOPHEN 500 MG PO TABS
1000.0000 mg | ORAL_TABLET | Freq: Once | ORAL | Status: AC
  Administered 2022-06-28: 1000 mg via ORAL

## 2022-06-28 MED ORDER — LIDOCAINE HCL 1 % IJ SOLN
INTRAMUSCULAR | Status: DC | PRN
  Administered 2022-06-28: 20 mL

## 2022-06-28 SURGICAL SUPPLY — 14 items
DEVICE MYOSURE REACH (MISCELLANEOUS) IMPLANT
DILATOR CANAL MILEX (MISCELLANEOUS) IMPLANT
GLOVE BIO SURGEON STRL SZ 6.5 (GLOVE) ×1 IMPLANT
GLOVE BIOGEL PI IND STRL 7.0 (GLOVE) ×1 IMPLANT
GOWN STRL REUS W/TWL LRG LVL3 (GOWN DISPOSABLE) ×1 IMPLANT
IV NS IRRIG 3000ML ARTHROMATIC (IV SOLUTION) ×1 IMPLANT
KIT PROCEDURE FLUENT (KITS) ×1 IMPLANT
KIT TURNOVER CYSTO (KITS) ×1 IMPLANT
PACK VAGINAL MINOR WOMEN LF (CUSTOM PROCEDURE TRAY) ×1 IMPLANT
PAD OB MATERNITY 4.3X12.25 (PERSONAL CARE ITEMS) ×1 IMPLANT
PAD PREP 24X48 CUFFED NSTRL (MISCELLANEOUS) ×1 IMPLANT
SEAL ROD LENS SCOPE MYOSURE (ABLATOR) ×1 IMPLANT
SLEEVE SCD COMPRESS KNEE MED (STOCKING) ×1 IMPLANT
TOWEL OR 17X24 6PK STRL BLUE (TOWEL DISPOSABLE) ×1 IMPLANT

## 2022-06-28 NOTE — Anesthesia Postprocedure Evaluation (Signed)
Anesthesia Post Note  Patient: Marisa Holden  Procedure(s) Performed: DILATATION & CURETTAGE/HYSTEROSCOPY WITH MYOSURE     Patient location during evaluation: PACU Anesthesia Type: MAC Level of consciousness: awake and alert Pain management: pain level controlled Vital Signs Assessment: post-procedure vital signs reviewed and stable Respiratory status: spontaneous breathing, nonlabored ventilation, respiratory function stable and patient connected to nasal cannula oxygen Cardiovascular status: stable and blood pressure returned to baseline Postop Assessment: no apparent nausea or vomiting Anesthetic complications: no   No notable events documented.  Last Vitals:  Vitals:   06/28/22 1000 06/28/22 1030  BP: (!) 153/68 (!) 161/69  Pulse: 64 84  Resp: 13 12  Temp:  (!) 36.3 C  SpO2: 94% 99%    Last Pain:  Vitals:   06/28/22 1030  TempSrc:   PainSc: 3                  Chontel Warning P Copper Kirtley

## 2022-06-28 NOTE — Transfer of Care (Signed)
Immediate Anesthesia Transfer of Care Note  Patient: Ellis Savage  Procedure(s) Performed: DILATATION & CURETTAGE/HYSTEROSCOPY WITH MYOSURE  Patient Location: PACU  Anesthesia Type:MAC  Level of Consciousness: awake and patient cooperative  Airway & Oxygen Therapy: Patient Spontanous Breathing and Patient connected to face mask oxygen  Post-op Assessment: Report given to RN and Post -op Vital signs reviewed and stable  Post vital signs: Reviewed and stable  Last Vitals:  Vitals Value Taken Time  BP    Temp    Pulse 71 06/28/22 0936  Resp 19 06/28/22 0936  SpO2 100 % 06/28/22 0936  Vitals shown include unvalidated device data.  Last Pain:  Vitals:   06/28/22 0825  TempSrc: Oral  PainSc: 0-No pain      Patients Stated Pain Goal: 3 (06/28/22 0825)  Complications: No notable events documented.

## 2022-06-28 NOTE — H&P (Signed)
Marisa Holden is an 77 y.o. female.G0   RP: D&C/hysteroscopy with Myosure Excision on 06/28/22   HPI: Light PMB x 3 days. Mild pelvic cramping. No HRT. Abstinent. Nothing in the vagina. No trauma. No vaginal discharge, no itching, no odor. No UTI Sx. No rectal bleeding. Normal BMs. No fever.  Sonohysto showed Irregular polypoid areas seen in the intra uterine cavity.  Endometrial biopsy done successfully, pathology benign.      Pertinent Gynecological History: Menses: post-menopausal Contraception: post menopausal status Blood transfusions: none Sexually transmitted diseases: no past history Previous GYN Procedures: None Last mammogram: normal  Last pap: normal   Menstrual History: No LMP recorded. Patient is postmenopausal.    Past Medical History:  Diagnosis Date   Anxiety    due to surgery   Arthritis    Hypertension    PMB (postmenopausal bleeding)    Pre-diabetes    diet controlled   Wears hearing aid in both ears     Past Surgical History:  Procedure Laterality Date   cataract surgery     bilateral with lens implant   COLONOSCOPY WITH PROPOFOL N/A 04/07/2014   Procedure: COLONOSCOPY WITH PROPOFOL;  Surgeon: Charolett Bumpers, MD;  Location: [facility];  Service: Endoscopy;  Laterality: N/A;   EYE SURGERY     left eye macular hole   TOTAL HIP ARTHROPLASTY Right 04/05/2016   Procedure: RIGHT TOTAL HIP ARTHROPLASTY ANTERIOR APPROACH;  Surgeon: Ollen Gross, MD;  Location: [facility] ORS;  Service: Orthopedics;  Laterality: Right;    Family History  Problem Relation Age of Onset   Diabetes Mother    Heart disease Mother    Diabetes Father    Heart disease Father    Breast cancer Sister 63   Cancer Brother        lung- smoker   Diabetes Brother     Social History:  reports that she quit smoking about 23 years ago. Her smoking use included cigarettes. She has never used smokeless tobacco. She reports current alcohol use. She reports that she does not use  drugs.  Allergies: No Known Allergies  Medications Prior to Admission  Medication Sig Dispense Refill Last Dose   dorzolamide-timolol (COSOPT) 2-0.5 % ophthalmic solution Place into the left eye 2 (two) times daily.   06/27/2022   latanoprost (XALATAN) 0.005 % ophthalmic solution Place 1 drop into both eyes at bedtime.    06/27/2022   losartan-hydrochlorothiazide (HYZAAR) 50-12.5 MG tablet Take 1 tablet by mouth daily.   06/27/2022   Multiple Vitamins-Minerals (PRESERVISION AREDS 2 PO) Take by mouth.   Past Week   Netarsudil Dimesylate (RHOPRESSA) 0.02 % SOLN Apply to eye. LEFT EYE ONLY   06/27/2022   rosuvastatin (CRESTOR) 5 MG tablet Take 5 mg by mouth daily.   06/27/2022    REVIEW OF SYSTEMS: A ROS was performed and pertinent positives and negatives are included in the history. GENERAL: No fevers or chills. HEENT: No change in vision, no earache, sore throat or sinus congestion. NECK: No pain or stiffness. CARDIOVASCULAR: No chest pain or pressure. No palpitations. PULMONARY: No shortness of breath, cough or wheeze. GASTROINTESTINAL: No abdominal pain, nausea, vomiting or diarrhea, melena or bright red blood per rectum. GENITOURINARY: No urinary frequency, urgency, hesitancy or dysuria. MUSCULOSKELETAL: No joint or muscle pain, no back pain, no recent trauma. DERMATOLOGIC: No rash, no itching, no lesions. ENDOCRINE: No polyuria, polydipsia, no heat or cold intolerance. No recent change in weight. HEMATOLOGICAL: No anemia or easy bruising or bleeding.  NEUROLOGIC: No headache, seizures, numbness, tingling or weakness. PSYCHIATRIC: No depression, no loss of interest in normal activity or change in sleep pattern.     Height 5\' 4"  (1.626 m), weight 63.5 kg.  Physical Exam:   Sono Infusion Hysterogram ( procedure note)     The initial transvaginal ultrasound demonstrated the following:   T/V images.  No latex used.  Anteverted uterus normal in size and shape with no myometrial mass.  The uterus  is measured at 7.6 x 4.57 x 3.32 cm.  The endometrial lining is measured at 9.19 mm.  A feeder vessel is suspected by Doppler.  Both ovaries are atrophic and normal in appearance.  No adnexal mass seen.  No free fluid in the pelvis.   The speculum  was inserted and the cervix cleansed with Betadine solution after confirming that patient has no allergies.A small sonohysterography catheter was utilized.  The cervix was stenotic, it was successfully dilated using the very small dilators and the os finder.  Insertion of the sonohysterography catheter was facilitated with ring forceps, using a spear-like motion the catheter was inserted to the fundus of the uterus. The speculum is then removed carefully to avoid dislodging the catheter. The catheter was flushed with sterile saline delete prior to insertion to rid it of small amounts of air.the sterile saline solution was infused into the uterine cavity as a vaginal ultrasound probe was then placed in the vagina for full visualization of the uterine cavity from a transvaginal approach. The following was noted:   Irregular polypoid areas seen in the intra uterine cavity.  Difficult to distinguish how many.   The catheter was then removed after retrieving some of the saline from the intrauterine cavity. An endometrial biopsy was done. Patient tolerated procedure well.      Assessment/Plan:  77 y.o. G0   1. Postmenopausal bleeding  Light PMB x 3 days.  Mild pelvic cramping.  No HRT.  Abstinent. Nothing in the vagina. No trauma.  No vaginal discharge, no itching, no odor.  No UTI Sx.  No rectal bleeding.  Normal BMs.  No fever. Pelvic US showing a thickened endometrial line with a feeder vessel.  Sonohysto therefore done.  Cervical stenosis overcome with the very small dilator kit and the os finder.  Irregular polypoid areas seen in the intra uterine cavity.  Endometrial biopsy done successfully, pathology benign.   HSC/Myosure Excisions/D+C today.  Preop prep,  surgery and risks, postop expectations and precautions reviewed thoroughly.   2. Thickened endometrium As above.   3. Endometrial polyp As above.   Other orders - losartan-hydrochlorothiazide (HYZAAR) 50-12.5 MG tablet; Take 1 tablet by mouth daily.                          Patient was counseled as to the risk of surgery to include the following:   1. Infection (prohylactic antibiotics will be administered)   2. DVT/Pulmonary Embolism (prophylactic pneumo compression stockings will be used)   3.Trauma to internal organs requiring additional surgical procedure to repair any injury to internal organs requiring perhaps additional hospitalization days.   4.Hemmorhage requiring transfusion and blood products which carry risks such as anaphylactic reaction, hepatitis and AIDS   Patient had received literature information on the procedure scheduled and all her questions were answered and fully accepts all risk.   Marie-Lyne Beaux Wedemeyer 06/28/2022, 8:25 AM

## 2022-06-28 NOTE — Anesthesia Preprocedure Evaluation (Addendum)
Anesthesia Evaluation  Patient identified by MRN, date of birth, ID band Patient awake    Reviewed: Allergy & Precautions, NPO status , Patient's Chart, lab work & pertinent test results  Airway Mallampati: II  TM Distance: >3 FB Neck ROM: Full    Dental no notable dental hx.    Pulmonary former smoker   Pulmonary exam normal        Cardiovascular hypertension, Pt. on medications  Rhythm:Regular Rate:Normal     Neuro/Psych   Anxiety     negative neurological ROS     GI/Hepatic negative GI ROS, Neg liver ROS,,,  Endo/Other  negative endocrine ROS    Renal/GU negative Renal ROS  Female GU complaint (AUB)     Musculoskeletal  (+) Arthritis ,    Abdominal Normal abdominal exam  (+)   Peds  Hematology negative hematology ROS (+)   Anesthesia Other Findings   Reproductive/Obstetrics                             Anesthesia Physical Anesthesia Plan  ASA: 2  Anesthesia Plan: MAC   Post-op Pain Management: Celebrex PO (pre-op)* and Tylenol PO (pre-op)*   Induction: Intravenous  PONV Risk Score and Plan: 3 and Ondansetron, Dexamethasone, Treatment may vary due to age or medical condition and Propofol infusion  Airway Management Planned: Simple Face Mask and Nasal Cannula  Additional Equipment: None  Intra-op Plan:   Post-operative Plan:   Informed Consent: I have reviewed the patients History and Physical, chart, labs and discussed the procedure including the risks, benefits and alternatives for the proposed anesthesia with the patient or authorized representative who has indicated his/her understanding and acceptance.     Dental advisory given  Plan Discussed with: CRNA  Anesthesia Plan Comments:        Anesthesia Quick Evaluation

## 2022-06-28 NOTE — Discharge Instructions (Addendum)
   D & C Home care Instructions:   Personal hygiene:  Used sanitary napkins for vaginal drainage not tampons. Shower or tub bathe the day after your procedure. No douching until bleeding stops. Always wipe from front to back after  Elimination.  Activity: Do not drive or operate any equipment today. The effects of the anesthesia are still present and drowsiness may result. Rest today, not necessarily flat bed rest, just take it easy. You may resume your normal activity in one to 2 days.  Sexual activity: No intercourse for one week or as indicated by your physician  Diet: Eat a light diet as desired this evening. You may resume a regular diet tomorrow.  Return to work: One to 2 days.  General Expectations of your surgery: Vaginal bleeding should be no heavier than a normal period. Spotting may continue up to 10 days. Mild cramps may continue for a couple of days. You may have a regular period in 2-6 weeks.  Unexpected observations call your doctor if these occur: persistent or heavy bleeding. Severe abdominal cramping or pain. Elevation of temperature greater than 100F. Inability to urinate.  Call for an appointment in two weeks.    Post Anesthesia Home Care Instructions  Activity: Get plenty of rest for the remainder of the day. A responsible individual must stay with you for 24 hours following the procedure.  For the next 24 hours, DO NOT: -Drive a car -Advertising copywriter -Drink alcoholic beverages -Take any medication unless instructed by your physician -Make any legal decisions or sign important papers.  Meals: Start with liquid foods such as gelatin or soup. Progress to regular foods as tolerated. Avoid greasy, spicy, heavy foods. If nausea and/or vomiting occur, drink only clear liquids until the nausea and/or vomiting subsides. Call your physician if vomiting continues.  Special Instructions/Symptoms: Your throat may feel dry or sore from the anesthesia or the breathing  tube placed in your throat during surgery. If this causes discomfort, gargle with warm salt water. The discomfort should disappear within 24 hours.   No ibuprofen, Advil, Aleve, Motrin, ketorolac, meloxicam, naproxen, or other NSAIDS until after 2:15 pm today if needed. No acetaminophen/Tylenol until after 2:15 pm today if needed.

## 2022-06-28 NOTE — Op Note (Signed)
Operative Note  06/28/2022  12:18 PM  PATIENT:  Marisa Holden  77 y.o. female  PRE-OPERATIVE DIAGNOSIS:  Postmenopausal bleeding, endometrial polyp, thickened endometrium  POST-OPERATIVE DIAGNOSIS:  Postmenopausal bleeding, endometrial polyp, thickened endometrium, submucosal fibroids  PROCEDURE:  Procedure(s): DILATATION & CURETTAGE/HYSTEROSCOPY WITH MYOSURE  SURGEON:  Surgeon(s): Genia Del, MD  ANESTHESIA:   IV sedation  FINDINGS: Both ostia well seen.  Areas of Endometrial Thickening and probable Submucosal Fibroids.  DESCRIPTION OF OPERATION: Under IV sedation, the patient is in lithotomy position.  She is prepped with Betadine on the suprapubic, vulvar and vaginal areas.  She is draped as usual.  Timeout done.  Patient had emptied her bladder just prior to entering the operating room.  The vaginal exam reveals an anteverted uterus, normal in volume and mobile.  No adnexal mass.  The speculum is inserted in the vagina and the anterior lip of the cervix is grasped with a tenaculum.  A paracervical block is done with lidocaine 1% a total of 20 cc at 4 and 8:00.  Dilation of the cervix with an os finder first and then with Shawnie Pons dilators up to #19 without difficulty.  The hysteroscope is inserted in the intra uterine cavity and inspection is done.  Both ostia are well seen.  Areas of endometrial thickening are present.  2 areas of bulging are present at the posterior uterine wall probably corresponding to partially submucosal fibroids.  The MyoSure reach is inserted through the hysteroscope.  All areas of endometrial thickening and the submucosal part of the posterior fibroids are excised.  Pictures were taken before and after excisions.  Hemostasis is adequate.  The MyoSure with the hysteroscope are removed from the intra uterine cavity.  We proceeded with a systematic curettage of the intra uterine cavity on all endometrial surfaces with a small sharp curette.  The curette is removed  from the intra uterine cavity.  Both specimens are sent together to pathology.  The tenaculum is removed from the cervix.  Hemostasis is adequate.  The speculum is removed.  The patient is brought to recovery room in good and stable status.  ESTIMATED BLOOD LOSS: 5 mL FLUID DEFICIT: 35 mL  Intake/Output Summary (Last 24 hours) at 06/28/2022 1218 Last data filed at 06/28/2022 8119 Gross per 24 hour  Intake 300 ml  Output 5 ml  Net 295 ml     BLOOD ADMINISTERED:none   LOCAL MEDICATIONS USED:  LIDOCAINE 1% 20 mL for Paracervical Block  SPECIMEN:  Source of Specimen:  excision material from thickened endometrium/Submucosal fibroids and Endometrial curettings  DISPOSITION OF SPECIMEN:  PATHOLOGY  COUNTS:  YES  PLAN OF CARE: Transfer to PACU  Marie-Lyne LavoieMD12:18 PM

## 2022-06-29 ENCOUNTER — Encounter (HOSPITAL_BASED_OUTPATIENT_CLINIC_OR_DEPARTMENT_OTHER): Payer: Self-pay | Admitting: Obstetrics & Gynecology

## 2022-06-29 LAB — SURGICAL PATHOLOGY

## 2022-07-12 ENCOUNTER — Encounter: Payer: Self-pay | Admitting: Obstetrics & Gynecology

## 2022-07-12 ENCOUNTER — Ambulatory Visit (INDEPENDENT_AMBULATORY_CARE_PROVIDER_SITE_OTHER): Payer: Medicare Other | Admitting: Obstetrics & Gynecology

## 2022-07-12 VITALS — BP 114/76 | HR 82 | Resp 97

## 2022-07-12 DIAGNOSIS — Z09 Encounter for follow-up examination after completed treatment for conditions other than malignant neoplasm: Secondary | ICD-10-CM

## 2022-07-12 NOTE — Progress Notes (Signed)
    Marisa Holden 12/12/1945 [id number]        77 y.o.  G0 Scientist, product/process development.  RP: Post op HSC/Myosure/D+C 06/28/22  HPI: Doing very well postop.  No PMB.  No pelvic pain.  No fever.  Urine/BMs normal.   OB History  Gravida Para Term Preterm AB Living  0 0 0 0 0 0  SAB IAB Ectopic Multiple Live Births  0 0 0 0 0    Past medical history,surgical history, problem list, medications, allergies, family history and social history were all reviewed and documented in the EPIC chart.   Directed ROS with pertinent positives and negatives documented in the history of present illness/assessment and plan.  Exam:  Vitals:   07/12/22 1506  BP: 114/76  Pulse: 82  Resp: (!) 97   General appearance:  Normal  Abdomen: Normal  Gynecologic exam: Vulva normal.  Bimanual exam:  Uterus AV, normal volume, mobile, NT.  No adnexal mass, NT.  Patho 06/28/22: FINAL MICROSCOPIC DIAGNOSIS:   A. ENDOMETRIUM, CURETTAGE:  - Fragments of endometrium with features suggestive of benign  endometrial polyp(S)  - Fragments of benign myometrium - Fragments of benign endocervical mucosa  - Scant fragment of benign squamous mucosa  - Negative for hyperplasia or malignancy    Assessment/Plan:  77 y.o. G0  1. Status post gynecological surgery, follow-up exam  Doing very well postop HSC/Myosure/D+C 06/28/22.  No PMB.  No pelvic pain.  No fever.  Urine/BMs normal. Patho benign, reviewed with patient. Normal Gyn exam.  Patient reassured.  Genia Del MD, 3:10 PM 07/12/2022

## 2022-09-13 DIAGNOSIS — H401131 Primary open-angle glaucoma, bilateral, mild stage: Secondary | ICD-10-CM | POA: Diagnosis not present

## 2022-09-13 DIAGNOSIS — H353132 Nonexudative age-related macular degeneration, bilateral, intermediate dry stage: Secondary | ICD-10-CM | POA: Diagnosis not present

## 2022-10-06 DIAGNOSIS — M7122 Synovial cyst of popliteal space [Baker], left knee: Secondary | ICD-10-CM | POA: Diagnosis not present

## 2022-10-06 DIAGNOSIS — M6281 Muscle weakness (generalized): Secondary | ICD-10-CM | POA: Diagnosis not present

## 2022-10-06 DIAGNOSIS — M25562 Pain in left knee: Secondary | ICD-10-CM | POA: Diagnosis not present

## 2022-10-06 DIAGNOSIS — M25551 Pain in right hip: Secondary | ICD-10-CM | POA: Diagnosis not present

## 2022-10-06 DIAGNOSIS — M7061 Trochanteric bursitis, right hip: Secondary | ICD-10-CM | POA: Diagnosis not present

## 2022-10-17 ENCOUNTER — Ambulatory Visit: Payer: Medicare Other | Admitting: Obstetrics and Gynecology

## 2022-10-17 ENCOUNTER — Encounter: Payer: Self-pay | Admitting: Obstetrics and Gynecology

## 2022-10-17 VITALS — BP 110/62 | HR 84 | Wt 142.0 lb

## 2022-10-17 DIAGNOSIS — B9689 Other specified bacterial agents as the cause of diseases classified elsewhere: Secondary | ICD-10-CM

## 2022-10-17 DIAGNOSIS — N898 Other specified noninflammatory disorders of vagina: Secondary | ICD-10-CM

## 2022-10-17 DIAGNOSIS — N760 Acute vaginitis: Secondary | ICD-10-CM

## 2022-10-17 LAB — WET PREP FOR TRICH, YEAST, CLUE

## 2022-10-17 MED ORDER — METRONIDAZOLE 0.75 % VA GEL
1.0000 | Freq: Every day | VAGINAL | 0 refills | Status: AC
Start: 2022-10-17 — End: 2022-10-22

## 2022-10-17 NOTE — Patient Instructions (Signed)
Consider regular use of sitz or regular baths.  Avoid scented soaps, lotions, and menstrual products, perfumes. Also avoid douching and applying any products directly into the vagina.

## 2022-10-17 NOTE — Progress Notes (Signed)
77 y.o. G0P0000 female here for abnormal vaginal discharge. No LMP recorded. Patient is postmenopausal.  Pt c/o white/yellow discharge, no itching/odor/irritation. Started after hysteroscopy.  GYN HISTORY: PMB, s/p hyst D&C for benign endometrial polyps 2024  OB History  Gravida Para Term Preterm AB Living  0 0 0 0 0 0  SAB IAB Ectopic Multiple Live Births  0 0 0 0 0    Past Medical History:  Diagnosis Date   Anxiety    due to surgery   Arthritis    Hypertension    PMB (postmenopausal bleeding)    Pre-diabetes    diet controlled   Wears hearing aid in both ears     Past Surgical History:  Procedure Laterality Date   cataract surgery     bilateral with lens implant   COLONOSCOPY WITH PROPOFOL N/A 04/07/2014   Procedure: COLONOSCOPY WITH PROPOFOL;  Surgeon: Charolett Bumpers, MD;  Location: [facility];  Service: Endoscopy;  Laterality: N/A;   DILATATION & CURETTAGE/HYSTEROSCOPY WITH MYOSURE N/A 06/28/2022   Procedure: DILATATION & CURETTAGE/HYSTEROSCOPY WITH MYOSURE;  Surgeon: Genia Del, MD;  Location: [facility];  Service: Gynecology;  Laterality: N/A;   EYE SURGERY     left eye macular hole   TOTAL HIP ARTHROPLASTY Right 04/05/2016   Procedure: RIGHT TOTAL HIP ARTHROPLASTY ANTERIOR APPROACH;  Surgeon: Ollen Gross, MD;  Location: [facility];  Service: Orthopedics;  Laterality: Right;    Current Outpatient Medications on File Prior to Visit  Medication Sig Dispense Refill   dorzolamide-timolol (COSOPT) 2-0.5 % ophthalmic solution Place into the left eye 2 (two) times daily.     latanoprost (XALATAN) 0.005 % ophthalmic solution Place 1 drop into both eyes at bedtime.      losartan-hydrochlorothiazide (HYZAAR) 50-12.5 MG tablet Take 1 tablet by mouth daily.     Multiple Vitamins-Minerals (PRESERVISION AREDS 2 PO) Take by mouth.     Netarsudil Dimesylate (RHOPRESSA) 0.02 % SOLN Apply to eye. LEFT EYE ONLY     rosuvastatin (CRESTOR) 5 MG tablet  Take 5 mg by mouth daily.     No current facility-administered medications on file prior to visit.    No Known Allergies    PE Today's Vitals   10/17/22 1423  BP: 110/62  Pulse: 84  SpO2: 98%  Weight: 142 lb (64.4 kg)   Body mass index is 24.76 kg/m.  Physical Exam Vitals reviewed. Exam conducted with a chaperone present.  Constitutional:      General: She is not in acute distress.    Appearance: Normal appearance.  HENT:     Head: Normocephalic and atraumatic.     Nose: Nose normal.  Eyes:     Extraocular Movements: Extraocular movements intact.     Conjunctiva/sclera: Conjunctivae normal.  Pulmonary:     Effort: Pulmonary effort is normal.  Genitourinary:    General: Normal vulva.     Exam position: Lithotomy position.     Vagina: Normal. No vaginal discharge.     Cervix: Normal. No cervical motion tenderness, discharge or lesion.     Uterus: Normal. Not enlarged and not tender.      Adnexa: Right adnexa normal and left adnexa normal.     Comments: White pasty material noted along external vulva. Musculoskeletal:        General: Normal range of motion.     Cervical back: Normal range of motion.  Neurological:     General: No focal deficit present.     Mental  Status: She is alert.  Psychiatric:        Mood and Affect: Mood normal.        Behavior: Behavior normal.       Assessment and Plan:        Vaginal discharge -     WET PREP FOR TRICH, YEAST, CLUE  Bacterial vaginosis -     metroNIDAZOLE; Place 1 Applicatorful vaginally at bedtime for 5 days.  Dispense: 50 g; Refill: 0   Discussed vulva hygiene care.  Rosalyn Gess, MD

## 2022-10-18 ENCOUNTER — Telehealth: Payer: Self-pay | Admitting: *Deleted

## 2022-10-18 NOTE — Telephone Encounter (Signed)
Call returned to patient.  Rx for Metrogel sent to [facility] on 10/8. Patient request Rx to [facility].   Pharmacy on file updated. Call placed to [facility] to assist patient with transferring Rx, detailed message left. Patient aware to f/u with [facility] for [facility]. Return call if any additional assistance needed.   Routing to provider for final review. Patient is agreeable to disposition. Will close encounter.

## 2022-10-30 ENCOUNTER — Encounter: Payer: Self-pay | Admitting: Anesthesiology

## 2022-10-31 ENCOUNTER — Other Ambulatory Visit: Payer: Self-pay | Admitting: Obstetrics and Gynecology

## 2022-10-31 DIAGNOSIS — Z1231 Encounter for screening mammogram for malignant neoplasm of breast: Secondary | ICD-10-CM

## 2022-11-01 DIAGNOSIS — M6281 Muscle weakness (generalized): Secondary | ICD-10-CM | POA: Diagnosis not present

## 2022-11-01 DIAGNOSIS — M25562 Pain in left knee: Secondary | ICD-10-CM | POA: Diagnosis not present

## 2022-11-01 DIAGNOSIS — M7061 Trochanteric bursitis, right hip: Secondary | ICD-10-CM | POA: Diagnosis not present

## 2022-11-01 DIAGNOSIS — M7122 Synovial cyst of popliteal space [Baker], left knee: Secondary | ICD-10-CM | POA: Diagnosis not present

## 2022-11-01 DIAGNOSIS — M25551 Pain in right hip: Secondary | ICD-10-CM | POA: Diagnosis not present

## 2022-11-07 ENCOUNTER — Telehealth: Payer: Self-pay

## 2022-11-07 MED ORDER — METRONIDAZOLE 0.75 % VA GEL: 1.0000 | Freq: Every day | VAGINAL | 0 refills | Status: AC

## 2022-11-07 NOTE — Telephone Encounter (Signed)
Patient left message on triage voicemail stating that her symptoms of BV have returned. Patient states that she took Metrogel and completed. Please advise.

## 2022-11-07 NOTE — Telephone Encounter (Signed)
Spoke with patient, advised per Dr. Kennith Center. Rx to verified pharmacy. Patient verbalizes understanding and is agreeable.   Encounter closed.

## 2022-11-23 ENCOUNTER — Ambulatory Visit: Payer: Medicare Other

## 2022-11-30 DIAGNOSIS — Z79899 Other long term (current) drug therapy: Secondary | ICD-10-CM | POA: Diagnosis not present

## 2022-11-30 DIAGNOSIS — R7303 Prediabetes: Secondary | ICD-10-CM | POA: Diagnosis not present

## 2022-11-30 DIAGNOSIS — E559 Vitamin D deficiency, unspecified: Secondary | ICD-10-CM | POA: Diagnosis not present

## 2022-11-30 DIAGNOSIS — E782 Mixed hyperlipidemia: Secondary | ICD-10-CM | POA: Diagnosis not present

## 2022-12-01 DIAGNOSIS — E782 Mixed hyperlipidemia: Secondary | ICD-10-CM | POA: Diagnosis not present

## 2022-12-01 DIAGNOSIS — R7303 Prediabetes: Secondary | ICD-10-CM | POA: Diagnosis not present

## 2022-12-01 DIAGNOSIS — E559 Vitamin D deficiency, unspecified: Secondary | ICD-10-CM | POA: Diagnosis not present

## 2022-12-01 DIAGNOSIS — I10 Essential (primary) hypertension: Secondary | ICD-10-CM | POA: Diagnosis not present

## 2022-12-01 DIAGNOSIS — Z0000 Encounter for general adult medical examination without abnormal findings: Secondary | ICD-10-CM | POA: Diagnosis not present

## 2022-12-11 ENCOUNTER — Ambulatory Visit (INDEPENDENT_AMBULATORY_CARE_PROVIDER_SITE_OTHER): Payer: Medicare Other | Admitting: Obstetrics and Gynecology

## 2022-12-11 ENCOUNTER — Encounter: Payer: Self-pay | Admitting: Obstetrics and Gynecology

## 2022-12-11 VITALS — BP 132/80 | HR 57 | Ht 63.25 in | Wt 141.0 lb

## 2022-12-11 DIAGNOSIS — N952 Postmenopausal atrophic vaginitis: Secondary | ICD-10-CM | POA: Diagnosis not present

## 2022-12-11 DIAGNOSIS — Z9189 Other specified personal risk factors, not elsewhere classified: Secondary | ICD-10-CM

## 2022-12-11 DIAGNOSIS — B3731 Acute candidiasis of vulva and vagina: Secondary | ICD-10-CM

## 2022-12-11 DIAGNOSIS — N898 Other specified noninflammatory disorders of vagina: Secondary | ICD-10-CM

## 2022-12-11 DIAGNOSIS — E2839 Other primary ovarian failure: Secondary | ICD-10-CM

## 2022-12-11 DIAGNOSIS — Z01419 Encounter for gynecological examination (general) (routine) without abnormal findings: Secondary | ICD-10-CM

## 2022-12-11 NOTE — Progress Notes (Signed)
77 y.o. y.o. female here for annual exam. No LMP recorded. Patient is postmenopausal.   Artist: loves oil painting and pottery.  [facility] community and [facility] doing this there Pelvic discharge: + white. No pruritis Pelvic pain: denies 2024 myosure D&C, benign endometrium Last mammogram: 12/24 Last colonoscopy: 2016.   Body mass index is 24.78 kg/m.      No data to display          Blood pressure 132/80, pulse (!) 57, height 5' 3.25" (1.607 m), weight 141 lb (64 kg), SpO2 100%.  No results found for: "DIAGPAP", "HPVHIGH", "ADEQPAP"  GYN HISTORY: No results found for: "DIAGPAP", "HPVHIGH", "ADEQPAP"  OB History  Gravida Para Term Preterm AB Living  0 0 0 0 0 0  SAB IAB Ectopic Multiple Live Births  0 0 0 0 0    Past Medical History:  Diagnosis Date   Anxiety    due to surgery   Arthritis    Hypertension    PMB (postmenopausal bleeding)    Pre-diabetes    diet controlled   Wears hearing aid in both ears     Past Surgical History:  Procedure Laterality Date   cataract surgery     bilateral with lens implant   COLONOSCOPY WITH PROPOFOL N/A 04/07/2014   Procedure: COLONOSCOPY WITH PROPOFOL;  Surgeon: Charolett Bumpers, MD;  Location: [facility];  Service: Endoscopy;  Laterality: N/A;   DILATATION & CURETTAGE/HYSTEROSCOPY WITH MYOSURE N/A 06/28/2022   Procedure: DILATATION & CURETTAGE/HYSTEROSCOPY WITH MYOSURE;  Surgeon: Genia Del, MD;  Location: [facility];  Service: Gynecology;  Laterality: N/A;   EYE SURGERY     left eye macular hole   TOTAL HIP ARTHROPLASTY Right 04/05/2016   Procedure: RIGHT TOTAL HIP ARTHROPLASTY ANTERIOR APPROACH;  Surgeon: Ollen Gross, MD;  Location: [facility];  Service: Orthopedics;  Laterality: Right;    Current Outpatient Medications on File Prior to Visit  Medication Sig Dispense Refill   dorzolamide-timolol (COSOPT) 2-0.5 % ophthalmic solution Place into the left eye 2 (two) times daily.      latanoprost (XALATAN) 0.005 % ophthalmic solution Place 1 drop into both eyes at bedtime.      losartan-hydrochlorothiazide (HYZAAR) 50-12.5 MG tablet Take 1 tablet by mouth daily.     Multiple Vitamins-Minerals (PRESERVISION AREDS 2 PO) Take by mouth.     Netarsudil Dimesylate (RHOPRESSA) 0.02 % SOLN Apply to eye. LEFT EYE ONLY     rosuvastatin (CRESTOR) 5 MG tablet Take 5 mg by mouth daily.     No current facility-administered medications on file prior to visit.    Social History   Socioeconomic History   Marital status: Married    Spouse name: Not on file   Number of children: Not on file   Years of education: Not on file   Highest education level: Not on file  Occupational History   Not on file  Tobacco Use   Smoking status: Former    Current packs/day: 0.00    Types: Cigarettes    Start date: 02/20/1989    Quit date: 02/21/1999    Years since quitting: 23.8   Smokeless tobacco: Never   Tobacco comments:    PMB, s/p hyst D&C for benign endometrial polyps 2024  Vaping Use   Vaping status: Never Used  Substance and Sexual Activity   Alcohol use: Yes    Comment: wine 1 to 2 per day   Drug use: No   Sexual activity: Not  Currently    Partners: Male    Birth control/protection: Post-menopausal, Abstinence    Comment: 1st intercourse- 18, partners- 6   Other Topics Concern   Not on file  Social History Narrative   Not on file   Social Determinants of Health   Financial Resource Strain: Not on file  Food Insecurity: Not on file  Transportation Needs: Not on file  Physical Activity: Not on file  Stress: Not on file  Social Connections: Not on file  Intimate Partner Violence: Not on file    Family History  Problem Relation Age of Onset   Diabetes Mother    Heart disease Mother    Diabetes Father    Heart disease Father    Breast cancer Sister 36   Cancer Brother        lung- smoker   Diabetes Brother      No Known Allergies    Patient's last menstrual  period was No LMP recorded. Patient is postmenopausal..            Review of Systems Alls systems reviewed and are negative.     Physical Exam Constitutional:      Appearance: Normal appearance.  Genitourinary:     Vulva and urethral meatus normal.     No lesions in the vagina.     Genitourinary Comments: Possible yeast infection     Right Labia: No rash, lesions or skin changes.    Left Labia: No lesions, skin changes or rash.    No vaginal discharge or tenderness.     No vaginal prolapse present.    Moderate vaginal atrophy present.     Right Adnexa: not tender, not palpable and no mass present.    Left Adnexa: not tender, not palpable and no mass present.    No cervical motion tenderness or discharge.     Uterus is not enlarged, tender or irregular.  Breasts:    Right: Normal.     Left: Normal.  HENT:     Head: Normocephalic.  Neck:     Thyroid: No thyroid mass, thyromegaly or thyroid tenderness.  Cardiovascular:     Rate and Rhythm: Normal rate and regular rhythm.     Heart sounds: Normal heart sounds, S1 normal and S2 normal.  Pulmonary:     Effort: Pulmonary effort is normal.     Breath sounds: Normal breath sounds and air entry.  Abdominal:     General: There is no distension.     Palpations: Abdomen is soft. There is no mass.     Tenderness: There is no abdominal tenderness. There is no guarding or rebound.  Musculoskeletal:        General: Normal range of motion.     Cervical back: Full passive range of motion without pain, normal range of motion and neck supple. No tenderness.     Right lower leg: No edema.     Left lower leg: No edema.  Neurological:     Mental Status: She is alert.  Skin:    General: Skin is warm.  Psychiatric:        Mood and Affect: Mood normal.        Behavior: Behavior normal.        Thought Content: Thought content normal.  Vitals and nursing note reviewed. Exam conducted with a chaperone present.       A:         Well Woman  GYN exam  P:        Pap smear not indicated Encouraged annual mammogram screening Colon cancer screening aged out DXA ordered today Labs and immunizations to do with PMD Discussed breast self exams Encouraged healthy lifestyle practices Encouraged Vit D and Calcium   No follow-ups on file.  Earley Favor

## 2022-12-12 LAB — SURESWAB® ADVANCED VAGINITIS PLUS,TMA
C. trachomatis RNA, TMA: NOT DETECTED
CANDIDA SPECIES: NOT DETECTED
Candida glabrata: NOT DETECTED
N. gonorrhoeae RNA, TMA: NOT DETECTED
SURESWAB(R) ADV BACTERIAL VAGINOSIS(BV),TMA: NEGATIVE
TRICHOMONAS VAGINALIS (TV),TMA: NOT DETECTED

## 2022-12-14 DIAGNOSIS — M25562 Pain in left knee: Secondary | ICD-10-CM | POA: Diagnosis not present

## 2022-12-25 ENCOUNTER — Telehealth: Payer: Self-pay | Admitting: *Deleted

## 2022-12-25 NOTE — Telephone Encounter (Signed)
Patient calling to inquire about 12/11/22 results.   Patient notified of results per Dr. Karma Greaser. Patient verbalizes understanding and is agreeable.  Encounter closed.

## 2022-12-26 ENCOUNTER — Ambulatory Visit
Admission: RE | Admit: 2022-12-26 | Discharge: 2022-12-26 | Disposition: A | Discharge status: Home / Self Care | Payer: Medicare Other | Source: Ambulatory Visit | Attending: Obstetrics and Gynecology

## 2022-12-26 DIAGNOSIS — Z1231 Encounter for screening mammogram for malignant neoplasm of breast: Secondary | ICD-10-CM

## 2023-01-09 ENCOUNTER — Other Ambulatory Visit (HOSPITAL_COMMUNITY)
Admission: RE | Admit: 2023-01-09 | Discharge: 2023-01-09 | Disposition: A | Discharge status: Home / Self Care | Payer: Medicare Other | Source: Ambulatory Visit | Attending: Obstetrics and Gynecology | Admitting: Obstetrics and Gynecology

## 2023-01-09 ENCOUNTER — Ambulatory Visit (INDEPENDENT_AMBULATORY_CARE_PROVIDER_SITE_OTHER): Payer: Medicare Other | Admitting: Obstetrics and Gynecology

## 2023-01-09 ENCOUNTER — Encounter: Payer: Self-pay | Admitting: Obstetrics and Gynecology

## 2023-01-09 VITALS — BP 138/72 | HR 89 | Wt 138.0 lb

## 2023-01-09 DIAGNOSIS — N950 Postmenopausal bleeding: Secondary | ICD-10-CM | POA: Diagnosis not present

## 2023-01-09 DIAGNOSIS — Z124 Encounter for screening for malignant neoplasm of cervix: Secondary | ICD-10-CM | POA: Diagnosis present

## 2023-01-09 DIAGNOSIS — N958 Other specified menopausal and perimenopausal disorders: Secondary | ICD-10-CM

## 2023-01-09 MED ORDER — ESTRADIOL 0.1 MG/GM VA CREA: TOPICAL_CREAM | VAGINAL | 1 refills | Status: DC

## 2023-01-09 NOTE — Assessment & Plan Note (Signed)
Reviewed safety profile of low dose vaginal estrogen, however reviewed that higher doses have been associated with DVT, breast and uterine cancer.

## 2023-01-09 NOTE — Assessment & Plan Note (Signed)
 Status post operative hysteroscopy in June 2024 Biopsy revealed benign polyps and normal endometrium However patient has had reoccurrence of vaginal bleeding this month. GSM noted on exam. Recommend trial of vaginal estrogen to rule out vulvar atrophy as a contributor to postmenopausal bleeding. Pap smear collected to rule out cervical cancer, however cervix is normal-appearing on exam. Return office in 3 months for follow-up.  Patient to call with persistent bleeding

## 2023-01-09 NOTE — Progress Notes (Signed)
 77 y.o. G0P0000 female here for abnormal vaginal discharge.  [facility] No LMP recorded. Patient is postmenopausal.  Patient has a history of PMB, s/p hyst D&C for benign endometrial polyps in June 2024   She was treated for bacterial vaginosis in October after the procedure. She notes continuous light vaginal bleeding since the beginning of December, some spotting and at times heavier bleeding.  She is worried about continued bleeding. No itching or irritation.  GYN HISTORY: PMB, s/p hyst D&C for benign endometrial polyps 2024  OB History  Gravida Para Term Preterm AB Living  0 0 0 0 0 0  SAB IAB Ectopic Multiple Live Births  0 0 0 0 0    Past Medical History:  Diagnosis Date   Anxiety    due to surgery   Arthritis    Hypertension    PMB (postmenopausal bleeding)    Pre-diabetes    diet controlled   Wears hearing aid in both ears     Past Surgical History:  Procedure Laterality Date   cataract surgery     bilateral with lens implant   COLONOSCOPY WITH PROPOFOL  N/A 04/07/2014   Procedure: COLONOSCOPY WITH PROPOFOL ;  Surgeon: Gladis MARLA Louder, MD;  Location: [facility];  Service: Endoscopy;  Laterality: N/A;   DILATATION & CURETTAGE/HYSTEROSCOPY WITH MYOSURE N/A 06/28/2022   Procedure: DILATATION & CURETTAGE/HYSTEROSCOPY WITH MYOSURE;  Surgeon: Lavoie, Marie-Lyne, MD;  Location: [facility] ;  Service: Gynecology;  Laterality: N/A;   EYE SURGERY     left eye macular hole   TOTAL HIP ARTHROPLASTY Right 04/05/2016   Procedure: RIGHT TOTAL HIP ARTHROPLASTY ANTERIOR APPROACH;  Surgeon: Dempsey Moan, MD;  Location: [facility];  Service: Orthopedics;  Laterality: Right;    Current Outpatient Medications on File Prior to Visit  Medication Sig Dispense Refill   dorzolamide-timolol (COSOPT) 2-0.5 % ophthalmic solution Place into the left eye 2 (two) times daily.     latanoprost  (XALATAN ) 0.005 % ophthalmic solution Place 1 drop into both eyes at  bedtime.      losartan -hydrochlorothiazide  (HYZAAR) 50-12.5 MG tablet Take 1 tablet by mouth daily.     Multiple Vitamins-Minerals (PRESERVISION AREDS 2 PO) Take by mouth.     Netarsudil Dimesylate (RHOPRESSA) 0.02 % SOLN Apply to eye. LEFT EYE ONLY     rosuvastatin (CRESTOR) 5 MG tablet Take 5 mg by mouth daily.     No current facility-administered medications on file prior to visit.    No Known Allergies    PE Today's Vitals   01/09/23 1415  BP: 138/72  Pulse: 89  SpO2: 98%  Weight: 138 lb (62.6 kg)   Body mass index is 24.25 kg/m.  Physical Exam Vitals reviewed. Exam conducted with a chaperone present.  Constitutional:      General: She is not in acute distress.    Appearance: Normal appearance.  HENT:     Head: Normocephalic and atraumatic.     Nose: Nose normal.  Eyes:     Extraocular Movements: Extraocular movements intact.     Conjunctiva/sclera: Conjunctivae normal.  Pulmonary:     Effort: Pulmonary effort is normal.  Genitourinary:    General: Normal vulva.     Exam position: Lithotomy position.     Vagina: Normal. No vaginal discharge or bleeding.     Cervix: Normal. No cervical motion tenderness, discharge or lesion.     Uterus: Normal. Not enlarged and not tender.      Adnexa: Right adnexa  normal and left adnexa normal.     Comments: White pasty material noted along external vulva. Musculoskeletal:        General: Normal range of motion.     Cervical back: Normal range of motion.  Neurological:     General: No focal deficit present.     Mental Status: She is alert.  Psychiatric:        Mood and Affect: Mood normal.        Behavior: Behavior normal.       Assessment and Plan:        PMB (postmenopausal bleeding) Assessment & Plan: Status post operative hysteroscopy in June 2024 Biopsy revealed benign polyps and normal endometrium However patient has had reoccurrence of vaginal bleeding this month. GSM noted on exam. Recommend trial of vaginal  estrogen to rule out vulvar atrophy as a contributor to postmenopausal bleeding. Pap smear collected to rule out cervical cancer, however cervix is normal-appearing on exam. Return office in 3 months for follow-up.  Patient to call with persistent bleeding   Genitourinary syndrome of menopause Assessment & Plan: Reviewed safety profile of low dose vaginal estrogen, however reviewed that higher doses have been associated with DVT, breast and uterine cancer.    Orders: -     Estradiol ; Apply 1/2 gram to vulva nightly for 2 weeks then decrease to 1/2 gram to vulva two nights a week.  Dispense: 42.5 g; Refill: 1  Cervical cancer screening -     Cytology - PAP   Vera LULLA Pa, MD

## 2023-01-16 LAB — CYTOLOGY - PAP: Diagnosis: NEGATIVE

## 2023-02-19 ENCOUNTER — Telehealth: Payer: Self-pay

## 2023-02-19 NOTE — Telephone Encounter (Signed)
-----   Message from CMA Alora  B sent at 02/19/2023  2:02 PM EST ----- Patient did not want to start estrogen meds so did not schedule the 3 month med fu. States she has stopped bleeding

## 2023-03-09 DIAGNOSIS — H401131 Primary open-angle glaucoma, bilateral, mild stage: Secondary | ICD-10-CM | POA: Diagnosis not present

## 2023-03-13 DIAGNOSIS — T161XXA Foreign body in right ear, initial encounter: Secondary | ICD-10-CM | POA: Diagnosis not present

## 2023-03-13 DIAGNOSIS — Z974 Presence of external hearing-aid: Secondary | ICD-10-CM | POA: Diagnosis not present

## 2023-03-13 DIAGNOSIS — H6123 Impacted cerumen, bilateral: Secondary | ICD-10-CM | POA: Diagnosis not present

## 2023-03-13 DIAGNOSIS — T162XXA Foreign body in left ear, initial encounter: Secondary | ICD-10-CM | POA: Diagnosis not present

## 2023-05-14 DIAGNOSIS — M5416 Radiculopathy, lumbar region: Secondary | ICD-10-CM | POA: Diagnosis not present

## 2023-05-14 DIAGNOSIS — M25552 Pain in left hip: Secondary | ICD-10-CM | POA: Diagnosis not present

## 2023-05-18 DIAGNOSIS — M5416 Radiculopathy, lumbar region: Secondary | ICD-10-CM | POA: Diagnosis not present

## 2023-05-22 DIAGNOSIS — M5416 Radiculopathy, lumbar region: Secondary | ICD-10-CM | POA: Diagnosis not present

## 2023-05-29 DIAGNOSIS — M5416 Radiculopathy, lumbar region: Secondary | ICD-10-CM | POA: Diagnosis not present

## 2023-08-31 DIAGNOSIS — M25511 Pain in right shoulder: Secondary | ICD-10-CM | POA: Diagnosis not present

## 2023-09-12 DIAGNOSIS — H353132 Nonexudative age-related macular degeneration, bilateral, intermediate dry stage: Secondary | ICD-10-CM | POA: Diagnosis not present

## 2023-09-12 DIAGNOSIS — H401132 Primary open-angle glaucoma, bilateral, moderate stage: Secondary | ICD-10-CM | POA: Diagnosis not present

## 2023-09-12 DIAGNOSIS — Z961 Presence of intraocular lens: Secondary | ICD-10-CM | POA: Diagnosis not present

## 2024-01-01 ENCOUNTER — Ambulatory Visit: Admitting: Obstetrics and Gynecology

## 2024-01-11 ENCOUNTER — Encounter: Payer: Self-pay | Admitting: Obstetrics and Gynecology

## 2024-01-11 ENCOUNTER — Ambulatory Visit (INDEPENDENT_AMBULATORY_CARE_PROVIDER_SITE_OTHER): Admitting: Obstetrics and Gynecology

## 2024-01-11 VITALS — BP 124/68 | HR 89 | Temp 97.8°F | Wt 146.0 lb

## 2024-01-11 DIAGNOSIS — R350 Frequency of micturition: Secondary | ICD-10-CM

## 2024-01-11 DIAGNOSIS — E2839 Other primary ovarian failure: Secondary | ICD-10-CM

## 2024-01-11 DIAGNOSIS — B3731 Acute candidiasis of vulva and vagina: Secondary | ICD-10-CM

## 2024-01-11 DIAGNOSIS — Z13820 Encounter for screening for osteoporosis: Secondary | ICD-10-CM

## 2024-01-11 LAB — WET PREP FOR TRICH, YEAST, CLUE

## 2024-01-11 MED ORDER — FLUCONAZOLE 150 MG PO TABS: 150.0000 mg | ORAL_TABLET | Freq: Once | ORAL | 1 refills | Status: AC

## 2024-01-11 NOTE — Progress Notes (Signed)
 "  79 y.o. G0P0000 female with GSM here for problem visit.  [facility] Married. No LMP recorded. Patient is postmenopausal.  Patient has a history of PMB, s/p hyst D&C for benign endometrial polyps in June 2024   She was treated for bacterial vaginosis in October after the procedure. She notes continuous light vaginal bleeding since the beginning of December, started on PV estrace  for GSM. However, she did not start medication and called Feb 2025 and reported bleeding had stopped.  Today, she reports vaginal itching, vaginal discharge, frequent urination for ~1 month. Nocturia x1. No dysuria, urgency or incontinence or PMB. Unable to pee during visit as she peed downstairs before coming up.     Component Value Date/Time   DIAGPAP  01/09/2023 1505    - Negative for intraepithelial lesion or malignancy (NILM)   ADEQPAP  01/09/2023 1505    Satisfactory for evaluation; transformation zone component PRESENT.   GYN HISTORY: PMB, s/p hyst D&C for benign endometrial polyps 2024  OB History  Gravida Para Term Preterm AB Living  0 0 0 0 0 0  SAB IAB Ectopic Multiple Live Births  0 0 0 0 0    Past Medical History:  Diagnosis Date   Anxiety    due to surgery   Arthritis    Hypertension    PMB (postmenopausal bleeding)    Pre-diabetes    diet controlled   Wears hearing aid in both ears     Past Surgical History:  Procedure Laterality Date   cataract surgery     bilateral with lens implant   COLONOSCOPY WITH PROPOFOL  N/A 04/07/2014   Procedure: COLONOSCOPY WITH PROPOFOL ;  Surgeon: Gladis MARLA Louder, MD;  Location: [facility];  Service: Endoscopy;  Laterality: N/A;   DILATATION & CURETTAGE/HYSTEROSCOPY WITH MYOSURE N/A 06/28/2022   Procedure: DILATATION & CURETTAGE/HYSTEROSCOPY WITH MYOSURE;  Surgeon: Lavoie, Marie-Lyne, MD;  Location: [facility] [place];  Service: Gynecology;  Laterality: N/A;   EYE SURGERY     left eye macular hole   TOTAL HIP ARTHROPLASTY  Right 04/05/2016   Procedure: RIGHT TOTAL HIP ARTHROPLASTY ANTERIOR APPROACH;  Surgeon: Dempsey Moan, MD;  Location: [facility];  Service: Orthopedics;  Laterality: Right;    Current Outpatient Medications on File Prior to Visit  Medication Sig Dispense Refill   dorzolamide-timolol (COSOPT) 2-0.5 % ophthalmic solution Place into the left eye 2 (two) times daily.     latanoprost  (XALATAN ) 0.005 % ophthalmic solution Place 1 drop into both eyes at bedtime.      losartan -hydrochlorothiazide  (HYZAAR) 50-12.5 MG tablet Take 1 tablet by mouth daily.     Multiple Vitamins-Minerals (PRESERVISION AREDS 2 PO) Take by mouth.     Netarsudil Dimesylate (RHOPRESSA) 0.02 % SOLN Apply to eye. LEFT EYE ONLY     rosuvastatin (CRESTOR) 5 MG tablet Take 5 mg by mouth daily.     No current facility-administered medications on file prior to visit.    No Known Allergies    PE Today's Vitals   01/11/24 1044  BP: 124/68  Pulse: 89  Temp: 97.8 F (36.6 C)  TempSrc: Oral  SpO2: 97%  Weight: 146 lb (66.2 kg)   Body mass index is 25.66 kg/m.  Physical Exam Vitals reviewed. Exam conducted with a chaperone present.  Constitutional:      General: She is not in acute distress.    Appearance: Normal appearance.  HENT:     Head: Normocephalic and atraumatic.     Nose: Nose  normal.  Eyes:     Extraocular Movements: Extraocular movements intact.     Conjunctiva/sclera: Conjunctivae normal.  Pulmonary:     Effort: Pulmonary effort is normal.  Genitourinary:    General: Normal vulva.     Exam position: Lithotomy position.     Vagina: Vaginal discharge present.     Cervix: Normal. No cervical motion tenderness, discharge or lesion.     Uterus: Normal. Not enlarged and not tender.      Adnexa: Right adnexa normal and left adnexa normal.  Musculoskeletal:        General: Normal range of motion.     Cervical back: Normal range of motion.  Neurological:     General: No focal deficit present.     Mental  Status: She is alert.  Psychiatric:        Mood and Affect: Mood normal.        Behavior: Behavior normal.      Assessment and Plan:        Frequent urination -     Urinalysis,Complete w/RFL Culture  Yeast vaginitis -     Fluconazole; Take 1 tablet (150 mg total) by mouth once for 1 dose.  Dispense: 1 tablet; Refill: 1 -     WET PREP FOR TRICH, YEAST, CLUE  Screening for osteoporosis -     DG Bone Density; Future  Ovarian failure due to menopause -     DG Bone Density; Future  Due July 2026  Vera LULLA Pa, MD  "

## 2024-01-13 LAB — URINE CULTURE
MICRO NUMBER:: 17419428
Result:: NO GROWTH
SPECIMEN QUALITY:: ADEQUATE

## 2024-01-13 LAB — URINALYSIS, COMPLETE W/RFL CULTURE
Bilirubin Urine: NEGATIVE
Glucose, UA: NEGATIVE
Hgb urine dipstick: NEGATIVE
Hyaline Cast: NONE SEEN /LPF
Ketones, ur: NEGATIVE
Leukocyte Esterase: NEGATIVE
Nitrites, Initial: NEGATIVE
Protein, ur: NEGATIVE
Specific Gravity, Urine: 1.026 (ref 1.001–1.035)
pH: 5.5 (ref 5.0–8.0)

## 2024-01-13 LAB — CULTURE INDICATED

## 2024-01-14 ENCOUNTER — Ambulatory Visit: Payer: Self-pay | Admitting: Obstetrics and Gynecology

## 2024-01-14 ENCOUNTER — Other Ambulatory Visit: Payer: Self-pay | Admitting: Obstetrics and Gynecology

## 2024-01-14 DIAGNOSIS — Z1231 Encounter for screening mammogram for malignant neoplasm of breast: Secondary | ICD-10-CM

## 2024-01-21 ENCOUNTER — Ambulatory Visit
